# Patient Record
Sex: Male | Born: 1985 | Race: White | Hispanic: No | Smoking: Former smoker
Health system: Southern US, Community
[De-identification: ages and names within clinical notes are randomized; demographics above are authoritative.]

## PROBLEM LIST (undated history)

## (undated) DIAGNOSIS — K219 Gastro-esophageal reflux disease without esophagitis: Secondary | ICD-10-CM

## (undated) DIAGNOSIS — F909 Attention-deficit hyperactivity disorder, unspecified type: Secondary | ICD-10-CM

## (undated) HISTORY — PX: EYE SURGERY: SHX253

## (undated) HISTORY — PX: ABDOMINAL SURGERY: SHX537

## (undated) HISTORY — PX: HAND SURGERY: SHX662

## (undated) HISTORY — PX: TONSILLECTOMY: SUR1361

---

## 2015-01-25 ENCOUNTER — Encounter (HOSPITAL_COMMUNITY): Payer: Self-pay | Admitting: Emergency Medicine

## 2015-01-25 ENCOUNTER — Emergency Department (HOSPITAL_COMMUNITY)
Admission: EM | Admit: 2015-01-25 | Discharge: 2015-01-25 | Disposition: A | Discharge status: Home / Self Care | Payer: 59 | Attending: Emergency Medicine | Admitting: Emergency Medicine

## 2015-01-25 DIAGNOSIS — S0101XA Laceration without foreign body of scalp, initial encounter: Secondary | ICD-10-CM | POA: Insufficient documentation

## 2015-01-25 DIAGNOSIS — Y929 Unspecified place or not applicable: Secondary | ICD-10-CM | POA: Diagnosis not present

## 2015-01-25 DIAGNOSIS — W228XXA Striking against or struck by other objects, initial encounter: Secondary | ICD-10-CM | POA: Diagnosis not present

## 2015-01-25 DIAGNOSIS — Z720 Tobacco use: Secondary | ICD-10-CM | POA: Insufficient documentation

## 2015-01-25 DIAGNOSIS — Y9389 Activity, other specified: Secondary | ICD-10-CM | POA: Insufficient documentation

## 2015-01-25 DIAGNOSIS — Y998 Other external cause status: Secondary | ICD-10-CM | POA: Insufficient documentation

## 2015-01-25 MED ORDER — LIDOCAINE-EPINEPHRINE (PF) 2 %-1:200000 IJ SOLN
10.0000 mL | Freq: Once | INTRAMUSCULAR | Status: AC
  Administered 2015-01-25: 10 mL
  Filled 2015-01-25: qty 20

## 2015-01-25 MED ORDER — TETANUS-DIPHTH-ACELL PERTUSSIS 5-2.5-18.5 LF-MCG/0.5 IM SUSP
0.5000 mL | Freq: Once | INTRAMUSCULAR | Status: AC
  Administered 2015-01-25: 0.5 mL via INTRAMUSCULAR
  Filled 2015-01-25: qty 0.5

## 2015-01-25 MED ORDER — BACITRACIN ZINC 500 UNIT/GM EX OINT: 1.0000 "application " | TOPICAL_OINTMENT | Freq: Two times a day (BID) | CUTANEOUS | Status: DC

## 2015-01-25 MED ORDER — NAPROXEN 500 MG PO TABS: 500.0000 mg | ORAL_TABLET | Freq: Two times a day (BID) | ORAL | Status: DC

## 2015-01-25 NOTE — ED Notes (Signed)
Patient here with left head laceration. States that he was "horsing around" and struck his head on the corner of a wall. 3-4" laceration noted to left head; bleeding controlled at this time.

## 2015-01-25 NOTE — Discharge Instructions (Signed)
Facial Laceration ° A facial laceration is a cut on the face. These injuries can be painful and cause bleeding. Lacerations usually heal quickly, but they need special care to reduce scarring. °DIAGNOSIS  °Your health care provider will take a medical history, ask for details about how the injury occurred, and examine the wound to determine how deep the cut is. °TREATMENT  °Some facial lacerations may not require closure. Others may not be able to be closed because of an increased risk of infection. The risk of infection and the chance for successful closure will depend on various factors, including the amount of time since the injury occurred. °The wound may be cleaned to help prevent infection. If closure is appropriate, pain medicines may be given if needed. Your health care provider will use stitches (sutures), wound glue (adhesive), or skin adhesive strips to repair the laceration. These tools bring the skin edges together to allow for faster healing and a better cosmetic outcome. If needed, you may also be given a tetanus shot. °HOME CARE INSTRUCTIONS °· Only take over-the-counter or prescription medicines as directed by your health care provider. °· Follow your health care provider's instructions for wound care. These instructions will vary depending on the technique used for closing the wound. °For Sutures: °· Keep the wound clean and dry.   °· If you were given a bandage (dressing), you should change it at least once a day. Also change the dressing if it becomes wet or dirty, or as directed by your health care provider.   °· Wash the wound with soap and water 2 times a day. Rinse the wound off with water to remove all soap. Pat the wound dry with a clean towel.   °· After cleaning, apply a thin layer of the antibiotic ointment recommended by your health care provider. This will help prevent infection and keep the dressing from sticking.   °· You may shower as usual after the first 24 hours. Do not soak the  wound in water until the sutures are removed.   °· Get your sutures removed as directed by your health care provider. With facial lacerations, sutures should usually be taken out after 4-5 days to avoid stitch marks.   °· Wait a few days after your sutures are removed before applying any makeup. °For Skin Adhesive Strips: °· Keep the wound clean and dry.   °· Do not get the skin adhesive strips wet. You may bathe carefully, using caution to keep the wound dry.   °· If the wound gets wet, pat it dry with a clean towel.   °· Skin adhesive strips will fall off on their own. You may trim the strips as the wound heals. Do not remove skin adhesive strips that are still stuck to the wound. They will fall off in time.   °For Wound Adhesive: °· You may briefly wet your wound in the shower or bath. Do not soak or scrub the wound. Do not swim. Avoid periods of heavy sweating until the skin adhesive has fallen off on its own. After showering or bathing, gently pat the wound dry with a clean towel.   °· Do not apply liquid medicine, cream medicine, ointment medicine, or makeup to your wound while the skin adhesive is in place. This may loosen the film before your wound is healed.   °· If a dressing is placed over the wound, be careful not to apply tape directly over the skin adhesive. This may cause the adhesive to be pulled off before the wound is healed.   °· Avoid   prolonged exposure to sunlight or tanning lamps while the skin adhesive is in place.  The skin adhesive will usually remain in place for 5-10 days, then naturally fall off the skin. Do not pick at the adhesive film.  After Healing: Once the wound has healed, cover the wound with sunscreen during the day for 1 full year. This can help minimize scarring. Exposure to ultraviolet light in the first year will darken the scar. It can take 1-2 years for the scar to lose its redness and to heal completely.  SEEK IMMEDIATE MEDICAL CARE IF:  You have redness, pain, or  swelling around the wound.   You see ayellowish-white fluid (pus) coming from the wound.   You have chills or a fever.  MAKE SURE YOU:  Understand these instructions.  Will watch your condition.  Will get help right away if you are not doing well or get worse. Document Released: 11/28/2004 Document Revised: 08/11/2013 Document Reviewed: 06/03/2013 [facility] Patient Information 2015 [place], [place]. This information is not intended to replace advice given to you by your health care provider. Make sure you discuss any questions you have with your health care provider.  Laceration Care, Adult A laceration is a cut or lesion that goes through all layers of the skin and into the tissue just beneath the skin. TREATMENT  Some lacerations may not require closure. Some lacerations may not be able to be closed due to an increased risk of infection. It is important to see your caregiver as soon as possible after an injury to minimize the risk of infection and maximize the opportunity for successful closure. If closure is appropriate, pain medicines may be given, if needed. The wound will be cleaned to help prevent infection. Your caregiver will use stitches (sutures), staples, wound glue (adhesive), or skin adhesive strips to repair the laceration. These tools bring the skin edges together to allow for faster healing and a better cosmetic outcome. However, all wounds will heal with a scar. Once the wound has healed, scarring can be minimized by covering the wound with sunscreen during the day for 1 full year. HOME CARE INSTRUCTIONS  For sutures or staples:  Keep the wound clean and dry.  If you were given a bandage (dressing), you should change it at least once a day. Also, change the dressing if it becomes wet or dirty, or as directed by your caregiver.  Wash the wound with soap and water 2 times a day. Rinse the wound off with water to remove all soap. Pat the wound dry with a clean  towel.  After cleaning, apply a thin layer of the antibiotic ointment as recommended by your caregiver. This will help prevent infection and keep the dressing from sticking.  You may shower as usual after the first 24 hours. Do not soak the wound in water until the sutures are removed.  Only take over-the-counter or prescription medicines for pain, discomfort, or fever as directed by your caregiver.  Get your sutures or staples removed as directed by your caregiver. For skin adhesive strips:  Keep the wound clean and dry.  Do not get the skin adhesive strips wet. You may bathe carefully, using caution to keep the wound dry.  If the wound gets wet, pat it dry with a clean towel.  Skin adhesive strips will fall off on their own. You may trim the strips as the wound heals. Do not remove skin adhesive strips that are still stuck to the wound. They will fall  off in time. For wound adhesive:  You may briefly wet your wound in the shower or bath. Do not soak or scrub the wound. Do not swim. Avoid periods of heavy perspiration until the skin adhesive has fallen off on its own. After showering or bathing, gently pat the wound dry with a clean towel.  Do not apply liquid medicine, cream medicine, or ointment medicine to your wound while the skin adhesive is in place. This may loosen the film before your wound is healed.  If a dressing is placed over the wound, be careful not to apply tape directly over the skin adhesive. This may cause the adhesive to be pulled off before the wound is healed.  Avoid prolonged exposure to sunlight or tanning lamps while the skin adhesive is in place. Exposure to ultraviolet light in the first year will darken the scar.  The skin adhesive will usually remain in place for 5 to 10 days, then naturally fall off the skin. Do not pick at the adhesive film. You may need a tetanus shot if:  You cannot remember when you had your last tetanus shot.  You have never had a  tetanus shot. If you get a tetanus shot, your arm may swell, get red, and feel warm to the touch. This is common and not a problem. If you need a tetanus shot and you choose not to have one, there is a rare chance of getting tetanus. Sickness from tetanus can be serious. SEEK MEDICAL CARE IF:   You have redness, swelling, or increasing pain in the wound.  You see a red line that goes away from the wound.  You have yellowish-white fluid (pus) coming from the wound.  You have a fever.  You notice a bad smell coming from the wound or dressing.  Your wound breaks open before or after sutures have been removed.  You notice something coming out of the wound such as wood or glass.  Your wound is on your hand or foot and you cannot move a finger or toe. SEEK IMMEDIATE MEDICAL CARE IF:   Your pain is not controlled with prescribed medicine.  You have severe swelling around the wound causing pain and numbness or a change in color in your arm, hand, leg, or foot.  Your wound splits open and starts bleeding.  You have worsening numbness, weakness, or loss of function of any joint around or beyond the wound.  You develop painful lumps near the wound or on the skin anywhere on your body. MAKE SURE YOU:   Understand these instructions.  Will watch your condition.  Will get help right away if you are not doing well or get worse. Document Released: 10/21/2005 Document Revised: 01/13/2012 Document Reviewed: 04/16/2011 [facility] Patient Information 2015 [place], [place]. This information is not intended to replace advice given to you by your health care provider. Make sure you discuss any questions you have with your health care provider.

## 2015-01-25 NOTE — ED Notes (Signed)
Denies LOC, dizziness, lightheadedness; ambulatory in and out of triage.

## 2015-01-25 NOTE — ED Notes (Signed)
Pt states he was "hoarsing around" and hit his head on "the corner of a wall." denies loc, pt alert, oriented x4, nad. Lac to left forehead.

## 2015-01-25 NOTE — ED Provider Notes (Signed)
CSN: [id number]     Arrival date & time 01/25/15  0223 History   First MD Initiated Contact with Patient 01/25/15 0326     Chief Complaint  Patient presents with  . Head Laceration    (Consider location/radiation/quality/duration/timing/severity/associated sxs/prior Treatment) HPI Comments: 29 year old male presents to the emergency department for further evaluation of a scalp laceration which patient obtained while he was "horsing around" and struck his head on the corner of a wall. Patient denies any loss of consciousness, lightheadedness, dizziness, nausea, vomiting, extremity numbness/weakness, vision changes or loss, or hearing changes. No meds taken PTA. Last tetanus unknown.  Patient is a 29 y.o. male presenting with scalp laceration. The history is provided by the patient. No language interpreter was used.  Head Laceration This is a new problem. The current episode started today. The problem occurs constantly. The problem has been unchanged. Pertinent negatives include no nausea, neck pain or vomiting. Nothing aggravates the symptoms. He has tried nothing for the symptoms.    History reviewed. No pertinent past medical history. History reviewed. No pertinent past surgical history. No family history on file. History  Substance Use Topics  . Smoking status: Current Some Day Smoker  . Smokeless tobacco: Not on file  . Alcohol Use: Yes    Review of Systems  HENT: Negative for hearing loss and tinnitus.   Eyes: Negative for visual disturbance.  Gastrointestinal: Negative for nausea and vomiting.  Musculoskeletal: Negative for neck pain.  Skin: Positive for wound.  Neurological: Negative for dizziness, syncope and light-headedness.  All other systems reviewed and are negative.   Allergies  Review of patient's allergies indicates no known allergies.  Home Medications   Prior to Admission medications   Medication Sig Start Date End Date Taking? Authorizing Provider    ibuprofen (ADVIL,MOTRIN) 200 MG tablet Take 600-800 mg by mouth every 6 (six) hours as needed for moderate pain.   Yes Historical Provider, MD  bacitracin ointment Apply 1 application topically 2 (two) times daily. 01/25/15   Antony Madura, PA-C  naproxen (NAPROSYN) 500 MG tablet Take 1 tablet (500 mg total) by mouth 2 (two) times daily. 01/25/15   Antony Madura, PA-C   BP 133/90 mmHg  Pulse 110  Temp(Src) 98.1 F (36.7 C) (Oral)  Resp 20  Ht  (1.702 m)  Wt 260 lb (117.935 kg)  BMI 40.71 kg/m2  SpO2 96%   Physical Exam  Constitutional: He is oriented to person, place, and time. He appears well-developed and well-nourished. No distress.  HENT:  Head: Normocephalic. Head is with laceration. Head is without raccoon's eyes and without Battle's sign.    Mouth/Throat: Oropharynx is clear and moist. No oropharyngeal exudate.  9cm laceration to forehead extending posteriorly to scalp. No active bleeding. No skull instability or FB. No Battle's sign or Raccoon's eyes.  Eyes: Conjunctivae and EOM are normal. Pupils are equal, round, and reactive to light. No scleral icterus.  Pupils equal round and reactive to light  Neck: Normal range of motion.  Pulmonary/Chest: Effort normal. No respiratory distress.  Respirations even and unlabored  Musculoskeletal: Normal range of motion.  Neurological: He is alert and oriented to person, place, and time. No cranial nerve deficit. He exhibits normal muscle tone. Coordination normal.  GCS 15. Speech is goal oriented. Patient moves extremities without ataxia. No focal neurologic deficits noted. He is ambulatory in the ED with steady gait.  Skin: Skin is warm and dry. No rash noted. He is not diaphoretic. No erythema. No  pallor.  Psychiatric: He has a normal mood and affect. His behavior is normal.  Nursing note and vitals reviewed.   ED Course  Procedures (including critical care time) Labs Review Labs Reviewed - No data to display  Imaging  Review No results found.   EKG Interpretation None      LACERATION REPAIR Performed by: Antony MaduraHUMES, Dmarius Reeder Authorized by: Antony MaduraHUMES, Onita Pfluger Consent: Verbal consent obtained. Risks and benefits: risks, benefits and alternatives were discussed Consent given by: patient Patient identity confirmed: provided demographic data Prepped and Draped in normal sterile fashion Wound explored  Laceration Location: scalp and forhead  Laceration Length: 9cm  No Foreign Bodies seen or palpated  Anesthesia: local infiltration  Local anesthetic: lidocaine 2% with epinephrine  Anesthetic total: 4 ml  Irrigation method: syringe Amount of cleaning: standard  Skin closure: 4-0 ethilon and staples  Number of sutures: 6 sutures and 1 staple  Technique: simple interrupted  Patient tolerance: Patient tolerated the procedure well with no immediate complications.  MDM   Final diagnoses:  Scalp laceration, initial encounter    Tdap booster given. Laceration occurred < 8 hours prior to repair which was well tolerated. Pt has no comorbidities to effect normal wound healing. Discussed suture home care with pt and answered questions. Pt to follow up for wound check and suture removal in 7 days. Pt is hemodynamically stable with no complaints prior to discharge. Patient discharged in good condition.   Filed Vitals:   01/25/15 0233 01/25/15 0448  BP: 133/90 115/69  Pulse: 110 98  Temp: 98.1 F (36.7 C)   TempSrc: Oral   Resp: 20 14  Height: 5\' 7"  (1.702 m)   Weight: 260 lb (117.935 kg)   SpO2: 96% 100%     Antony MaduraKelly Chiann Goffredo, PA-C 01/25/15 [id number]  Marisa Severinlga Otter, MD 01/26/15 [phone]

## 2015-01-31 ENCOUNTER — Emergency Department (HOSPITAL_COMMUNITY)
Admission: EM | Admit: 2015-01-31 | Discharge: 2015-01-31 | Disposition: A | Discharge status: Home / Self Care | Payer: 59 | Attending: Emergency Medicine | Admitting: Emergency Medicine

## 2015-01-31 ENCOUNTER — Encounter (HOSPITAL_COMMUNITY): Payer: Self-pay | Admitting: *Deleted

## 2015-01-31 DIAGNOSIS — Z792 Long term (current) use of antibiotics: Secondary | ICD-10-CM | POA: Insufficient documentation

## 2015-01-31 DIAGNOSIS — Z4802 Encounter for removal of sutures: Secondary | ICD-10-CM | POA: Insufficient documentation

## 2015-01-31 DIAGNOSIS — Z791 Long term (current) use of non-steroidal anti-inflammatories (NSAID): Secondary | ICD-10-CM | POA: Diagnosis not present

## 2015-01-31 DIAGNOSIS — Z720 Tobacco use: Secondary | ICD-10-CM | POA: Diagnosis not present

## 2015-01-31 NOTE — ED Provider Notes (Signed)
CSN: [id number]     Arrival date & time 01/31/15  1806 History  This chart was scribed for Chase BattiestElizabeth Carrine Kroboth, NP, working with Doug SouSam Jacubowitz, MD by Elon SpannerGarrett Cook, ED Scribe. This patient was seen in room TR05C/TR05C and the patient's care was started at 6:15 PM.  No chief complaint on file.  The history is provided by the patient. No language interpreter was used.   HPI Comments: Chase Chavez is a 29 y.o. male who presents to the Emergency Department complaining of suture removal.  Patient was seen on 3/23 in the ED after he sustained a head laceration without LOC while horsing around.  A laceration repair was performed and the patient was told to return to ED in 7 days for suture removal.  Patient denies fever, chills.  No past medical history on file. No past surgical history on file. No family history on file. History  Substance Use Topics  . Smoking status: Current Some Day Smoker  . Smokeless tobacco: Not on file  . Alcohol Use: Yes    Review of Systems  Skin: Positive for wound.      Allergies  Review of patient's allergies indicates no known allergies.  Home Medications   Prior to Admission medications   Medication Sig Start Date End Date Taking? Authorizing Provider  bacitracin ointment Apply 1 application topically 2 (two) times daily. 01/25/15   Antony MaduraKelly Humes, PA-C  ibuprofen (ADVIL,MOTRIN) 200 MG tablet Take 600-800 mg by mouth every 6 (six) hours as needed for moderate pain.    Historical Provider, MD  naproxen (NAPROSYN) 500 MG tablet Take 1 tablet (500 mg total) by mouth 2 (two) times daily. 01/25/15   Antony MaduraKelly Humes, PA-C   There were no vitals taken for this visit. Physical Exam  Constitutional: He is oriented to person, place, and time. He appears well-developed and well-nourished. No distress.  HENT:  Head: Normocephalic and atraumatic.  Eyes: Conjunctivae and EOM are normal.  Neck: Neck supple. No tracheal deviation present.  Cardiovascular: Normal rate.    Pulmonary/Chest: Effort normal. No respiratory distress.  Musculoskeletal: Normal range of motion.  Neurological: He is alert and oriented to person, place, and time.  Skin: Skin is warm and dry.  6 external sutures well-healed, well-approximated with staples on left frontal scalp  Psychiatric: He has a normal mood and affect. His behavior is normal.  Nursing note and vitals reviewed.   ED Course  Procedures (including critical care time)  DIAGNOSTIC STUDIES: Oxygen Saturation is 100% on RA, normal by my interpretation.    COORDINATION OF CARE:  6:14 PM Discussed treatment plan with patient at bedside.  Patient acknowledges and agrees with plan.    Labs Review Labs Reviewed - No data to display  Imaging Review No results found.   EKG Interpretation None      MDM   Final diagnoses:  Encounter for removal of sutures  Encounter for removal of staples    29 yo to ER for staple and suture removal and wound check as above. Six external sutures removed and 1 staple removed. Procedure tolerated well. Vitals normal, no signs of infection. Scar minimization & return precautions given at dc.    I personally performed the services described in this documentation, which was scribed in my presence. The recorded information has been reviewed and is accurate.  Filed Vitals:   01/31/15 1816 01/31/15 1933  BP: 143/72 132/72  Pulse: 96 85  Temp: 98.2 F (36.8 C) 98.3 F (36.8 C)  TempSrc:  Oral Oral  Resp: 18 20  SpO2: 98% 99%   Meds given in ED:  Medications - No data to display  Discharge Medication List as of 01/31/2015  7:28 PM        Chase Battiest, NP 02/02/15 1132  Doug Sou, MD 02/02/15 [phone]

## 2015-01-31 NOTE — ED Notes (Signed)
Pt here for suture removal on forehead, sutures were placed on 3/23. No other complaints.

## 2015-01-31 NOTE — Discharge Instructions (Signed)
Please follow the directions provided. Be sure to follow-up with your primary care doctor if needed. Please keep the healed wound clean and dry. Don't hesitate to return for any new, worsening, or concerning symptoms.   SEEK MEDICAL CARE IF:  You have increasing redness, swelling, or pain in the wound.  You see pus coming from the wound.  You have a fever.  You notice a bad smell coming from the wound or dressing.  Your wound breaks open (edges not staying together).

## 2017-10-08 ENCOUNTER — Other Ambulatory Visit: Payer: Self-pay

## 2017-10-08 ENCOUNTER — Emergency Department (HOSPITAL_BASED_OUTPATIENT_CLINIC_OR_DEPARTMENT_OTHER)
Admission: EM | Admit: 2017-10-08 | Discharge: 2017-10-08 | Disposition: A | Discharge status: Home / Self Care | Payer: Worker's Compensation | Attending: Emergency Medicine | Admitting: Emergency Medicine

## 2017-10-08 ENCOUNTER — Encounter (HOSPITAL_BASED_OUTPATIENT_CLINIC_OR_DEPARTMENT_OTHER): Payer: Self-pay

## 2017-10-08 DIAGNOSIS — Y999 Unspecified external cause status: Secondary | ICD-10-CM | POA: Insufficient documentation

## 2017-10-08 DIAGNOSIS — S61412A Laceration without foreign body of left hand, initial encounter: Secondary | ICD-10-CM | POA: Insufficient documentation

## 2017-10-08 DIAGNOSIS — W268XXA Contact with other sharp object(s), not elsewhere classified, initial encounter: Secondary | ICD-10-CM | POA: Diagnosis not present

## 2017-10-08 DIAGNOSIS — Y929 Unspecified place or not applicable: Secondary | ICD-10-CM | POA: Insufficient documentation

## 2017-10-08 DIAGNOSIS — F17210 Nicotine dependence, cigarettes, uncomplicated: Secondary | ICD-10-CM | POA: Insufficient documentation

## 2017-10-08 DIAGNOSIS — S6992XA Unspecified injury of left wrist, hand and finger(s), initial encounter: Secondary | ICD-10-CM | POA: Diagnosis present

## 2017-10-08 DIAGNOSIS — Y9389 Activity, other specified: Secondary | ICD-10-CM | POA: Insufficient documentation

## 2017-10-08 MED ORDER — CEPHALEXIN 500 MG PO CAPS: 500.0000 mg | ORAL_CAPSULE | Freq: Four times a day (QID) | ORAL | 0 refills | Status: DC

## 2017-10-08 MED ORDER — TETANUS-DIPHTH-ACELL PERTUSSIS 5-2.5-18.5 LF-MCG/0.5 IM SUSP: 0.5000 mL | Freq: Once | INTRAMUSCULAR | Status: DC

## 2017-10-08 MED ORDER — LIDOCAINE HCL 2 % IJ SOLN: 10.0000 mL | Freq: Once | INTRAMUSCULAR | Status: DC

## 2017-10-08 MED FILL — CEPHALEXIN 500 MG CAPSULE: 500 | 5 days supply | Qty: 20 | Fill #0

## 2017-10-08 NOTE — ED Notes (Signed)
ED Provider at bedside. 

## 2017-10-08 NOTE — Discharge Instructions (Signed)
Please see the information and instructions below regarding your visit.  Your diagnoses today include:  1. Laceration of left hand without foreign body, initial encounter     Dr. Janee Mornhompson will see you tomorrow.  We closed this with one suture.  Tests performed today include: Vital signs. See below for your results today.   Medications prescribed:   Take any prescribed medications only as directed.  Ibuprofen alternating with Tylenol for pain.  You can take 400-600 mg of ibuprofen every 6 hours as needed for pain.  You may take 650 mg of Tylenol every 6 hours as needed for pain.  Please do not exceed 4 g of Tylenol in 1 day.  Home care instructions:  Follow any educational materials and wound care instructions contained in this packet.   Keep affected area above the level of your heart when possible to minimize swelling. Wash area gently twice a day with warm soapy water. Do not apply alcohol or hydrogen peroxide directly over a wound. Cover the area if it is draining or weeping. Keep the bandage in place for 24 hours and refrain from getting the wound wet for 24 hours. After that, you may get the area wet, but please ensure that you dry it completely afterwards.  Please refrain from soaking sutures for long periods of time, or swimming in chlorinated water   You may apply antibiotic ointment such as Bacitracin or Neosporin.  Follow-up instructions: Please see Dr. Janee Mornhompson and hand surgery tomorrow at 12:45 Pm.  Return instructions:  Return to the Emergency Department if you have: Fever Worsening pain Worsening swelling of the wound Pus draining from the wound Redness of the skin that moves away from the wound, especially if it streaks away from the affected area  Any other emergent concerns  Your vital signs today were: BP 113/71 (BP Location: Right Arm)    Pulse 87    Temp 98.4 F (36.9 C) (Oral)    Resp 18    Ht 5\' 7"  (1.702 m)    Wt 106.6 kg (235 lb)    SpO2 98%    BMI 36.81  kg/m  If your blood pressure (BP) was elevated on multiple readings during this visit above 130 for the top number or above 80 for the bottom number, please have this repeated by your primary care provider within one month. --------------  Thank you for allowing us to participate in your care today! It was a pleasure taking care of you.

## 2017-10-08 NOTE — ED Provider Notes (Signed)
[facility] EMERGENCY DEPARTMENT Provider Note   CSN: [id number] Arrival date & time: 10/08/17  1408     History   Chief Complaint Chief Complaint  Patient presents with  . Hand Injury    HPI Ray ChurchLogan Blevens is a 31 y.o. male.  HPI   Patient is a 31 year old male with no significant past medical history presenting for a laceration to his left hand.  Patient reports that around 9:30 AM on 10-08-2017, he was cutting zip ties with a box cutter and cut his hand in the process of cutting a zip tie.  Patient reports that immediately after this experience, he had numbness to the radial side of his fourth digit on the left hand.  Patient did not appreciate any weakness.  Minimal bleeding at the time.  Patient immediately covered the wound.  Tetanus shot is up-to-date.  History reviewed. No pertinent past medical history.  There are no active problems to display for this patient.   Past Surgical History:  Procedure Laterality Date  . EYE SURGERY         Home Medications    Prior to Admission medications   Medication Sig Start Date End Date Taking? Authorizing Provider  cephALEXin (KEFLEX) 500 MG capsule Take 1 capsule (500 mg total) by mouth 4 (four) times daily. 10/08/17   Aviva KluverMurray, Aviyon Hocevar B, PA-C  ibuprofen (ADVIL,MOTRIN) 200 MG tablet Take 600-800 mg by mouth every 6 (six) hours as needed for moderate pain.    [provider]    Family History No family history on file.  Social History Social History   Tobacco Use  . Smoking status: Current Every Day Smoker    Types: Cigarettes  . Smokeless tobacco: Never Used  Substance Use Topics  . Alcohol use: Yes    Comment: occ  . Drug use: No     Allergies   Patient has no known allergies.   Review of Systems Review of Systems  Skin: Positive for wound. Negative for color change.  Neurological: Positive for numbness. Negative for weakness.     Physical Exam Updated Vital Signs BP 113/71 (BP  Location: Right Arm)   Pulse 87   Temp 98.4 F (36.9 C) (Oral)   Resp 18   Ht 5\' 7"  (1.702 m)   Wt 106.6 kg (235 lb)   SpO2 98%   BMI 36.81 kg/m   Physical Exam  Constitutional: He appears well-developed and well-nourished. No distress.  Sitting comfortably in bed.  HENT:  Head: Normocephalic and atraumatic.  Eyes: Conjunctivae are normal. Right eye exhibits no discharge. Left eye exhibits no discharge.  EOMs normal to gross examination.  Neck: Normal range of motion.  Cardiovascular: Normal rate and regular rhythm.  Intact, 2+ radial and ulnar pulse of the left hand.  Pulmonary/Chest:  Normal respiratory effort. Patient converses comfortably. No audible wheeze or stridor.  Abdominal: He exhibits no distension.  Musculoskeletal: Normal range of motion.  Left Hand Exam:  Inspection: There is approximately 2.5 cm laceration extending down to the adipose of the distal palm of the left hand.  Please see picture for details.  There is an exposed artery that is intact.  No tendon exposure. ROM: Passive/active ROM intact at wrist, MCP, PIP, and DIP joints, thumb MCP and IP joints, and no rotational deformity of metacarpals noted. Ligamentous stability: No laxity to valgus/varus stress of MCP, PIP, or DIP joints. Flexor/Extensor tendons: FDS/FDP tendons intact in digits 2-5 at PIP/DIP joints, respectively; extensor tendons intact in  all digits Nerve testing:  Patient is numb on the radial side of the left fourth digit.  Area of greatest numbness between PIP and DIP joints.  Patient preserved sensation at the distal tip of the fourth digit of the left hand, consistent with radial digital nerve.  Patient also preserves sensation over the entire ulnar side of the third digit. Vascular: 2+ radial and ulnar pulses. Capillary refill <2 seconds b/l.  Neurological: He is alert.  Cranial nerves intact to gross observation. Patient moves extremities without difficulty.  Skin: Skin is warm and dry.  He is not diaphoretic.  Psychiatric: He has a normal mood and affect. His behavior is normal. Judgment and thought content normal.  Nursing note and vitals reviewed.      ED Treatments / Results  Labs (all labs ordered are listed, but only abnormal results are displayed) Labs Reviewed - No data to display  EKG  EKG Interpretation None       Radiology No results found.  Procedures .Marland KitchenLaceration Repair Date/Time: 10/08/2017 5:24 PM Performed by: Elisha Ponder, PA-C Authorized by: Elisha Ponder, PA-C   Consent:    Consent obtained:  Verbal   Consent given by:  Patient Anesthesia (see MAR for exact dosages):    Anesthesia method:  Local infiltration   Local anesthetic:  Lidocaine 2% w/o epi Laceration details:    Location:  Hand   Hand location:  L palm   Length (cm):  2.5 Repair type:    Repair type:  Simple Pre-procedure details:    Preparation:  Patient was prepped and draped in usual sterile fashion Exploration:    Hemostasis achieved with:  Direct pressure   Wound exploration: wound explored through full range of motion and entire depth of wound probed and visualized     Wound extent: areolar tissue violated     Wound extent comment:  Likely nerve damage to radial digital nerve.    Contaminated: no   Treatment:    Area cleansed with:  Betadine   Amount of cleaning:  Standard   Irrigation solution:  Sterile saline   Irrigation method:  Syringe Skin repair:    Repair method:  Sutures   Suture size:  4-0   Wound skin closure material used: Vicryl Rapide.   Suture technique:  Simple interrupted   Number of sutures:  1 Approximation:    Approximation:  Loose Post-procedure details:    Dressing:  Non-adherent dressing   Patient tolerance of procedure:  Tolerated well, no immediate complications   (including critical care time)  Medications Ordered in ED Medications  lidocaine (XYLOCAINE) 2 % (with pres) injection 200 mg (not administered)      Initial Impression / Assessment and Plan / ED Course  I have reviewed the triage vital signs and the nursing notes.  Pertinent labs & imaging results that were available during my care of the patient were reviewed by me and considered in my medical decision making (see chart for details).      Final Clinical Impressions(s) / ED Diagnoses   Final diagnoses:  Laceration of left hand without foreign body, initial encounter   Patient is well-appearing in no acute distress.  Patient demonstrates a possible radial digital nerve injury to the radial aspect of the left fourth finger.  Wound was irrigated with 1000 mL of sterile saline.  Tetanus is up-to-date.  This wound was discussed via phone with Dr. Janee Morn.  Plan to place 1 Vicryl Rapide suture to hold the wound  together, dressed, and follow-up tomorrow in the office.  Keflex for prophylaxis due to delayed primary closure.  Patient given return precautions for any erythema or purulent drainage.  Patient is understanding and agrees with the plan of care.  This is a supervised visit with Dr. Rolland PorterMark James. Evaluation, management, and discharge planning discussed with this attending physician.   ED Discharge Orders        Ordered    cephALEXin (KEFLEX) 500 MG capsule  4 times daily     10/08/17 1711        Delia ChimesMurray, Joss Mcdill B, PA-C 10/08/17 1726    Rolland PorterJames, Mark, MD 10/12/17 [phone]

## 2017-10-08 NOTE — ED Triage Notes (Signed)
Pt states he cut left hand cutting through a zip tie at work-[facility]-pt has coban wrap to left hand-NAD-steady gait

## 2017-10-08 NOTE — ED Notes (Addendum)
Bulky dressing applied to palm of hand with xeroform per PA request

## 2018-02-10 ENCOUNTER — Emergency Department (HOSPITAL_BASED_OUTPATIENT_CLINIC_OR_DEPARTMENT_OTHER)
Admission: EM | Admit: 2018-02-10 | Discharge: 2018-02-10 | Disposition: A | Discharge status: Home / Self Care | Payer: 59 | Attending: Emergency Medicine | Admitting: Emergency Medicine

## 2018-02-10 ENCOUNTER — Encounter (HOSPITAL_BASED_OUTPATIENT_CLINIC_OR_DEPARTMENT_OTHER): Payer: Self-pay

## 2018-02-10 DIAGNOSIS — L509 Urticaria, unspecified: Secondary | ICD-10-CM | POA: Diagnosis not present

## 2018-02-10 DIAGNOSIS — T7840XA Allergy, unspecified, initial encounter: Secondary | ICD-10-CM | POA: Diagnosis not present

## 2018-02-10 DIAGNOSIS — R21 Rash and other nonspecific skin eruption: Secondary | ICD-10-CM | POA: Diagnosis present

## 2018-02-10 DIAGNOSIS — L299 Pruritus, unspecified: Secondary | ICD-10-CM | POA: Diagnosis not present

## 2018-02-10 DIAGNOSIS — F17210 Nicotine dependence, cigarettes, uncomplicated: Secondary | ICD-10-CM | POA: Diagnosis not present

## 2018-02-10 LAB — CBC WITH DIFFERENTIAL/PLATELET
BASOS ABS: 0 10*3/uL (ref 0.0–0.1)
Basophils Relative: 0 %
Eosinophils Absolute: 0.3 10*3/uL (ref 0.0–0.7)
Eosinophils Relative: 3 %
HCT: 43 % (ref 39.0–52.0)
HEMOGLOBIN: 14.7 g/dL (ref 13.0–17.0)
Lymphocytes Relative: 19 %
Lymphs Abs: 2 10*3/uL (ref 0.7–4.0)
MCH: 31.5 pg (ref 26.0–34.0)
MCHC: 34.2 g/dL (ref 30.0–36.0)
MCV: 92.3 fL (ref 78.0–100.0)
MONOS PCT: 8 %
Monocytes Absolute: 0.8 10*3/uL (ref 0.1–1.0)
Neutro Abs: 7.5 10*3/uL (ref 1.7–7.7)
Neutrophils Relative %: 70 %
Platelets: 272 10*3/uL (ref 150–400)
RBC: 4.66 MIL/uL (ref 4.22–5.81)
RDW: 12.3 % (ref 11.5–15.5)
WBC: 10.5 10*3/uL (ref 4.0–10.5)

## 2018-02-10 LAB — COMPREHENSIVE METABOLIC PANEL
ALBUMIN: 3.5 g/dL (ref 3.5–5.0)
ALK PHOS: 94 U/L (ref 38–126)
ALT: 32 U/L (ref 17–63)
AST: 19 U/L (ref 15–41)
Anion gap: 9 (ref 5–15)
BILIRUBIN TOTAL: 0.5 mg/dL (ref 0.3–1.2)
BUN: 18 mg/dL (ref 6–20)
CALCIUM: 8.6 mg/dL — AB (ref 8.9–10.3)
CO2: 23 mmol/L (ref 22–32)
CREATININE: 1.07 mg/dL (ref 0.61–1.24)
Chloride: 103 mmol/L (ref 101–111)
GFR calc Af Amer: >60 mL/min (ref >60)
GFR calc non Af Amer: >60 mL/min (ref >60)
Glucose, Bld: 237 mg/dL — ABNORMAL HIGH (ref 65–99)
Potassium: 3.9 mmol/L (ref 3.5–5.1)
Sodium: 135 mmol/L (ref 135–145)
TOTAL PROTEIN: 6.6 g/dL (ref 6.5–8.1)

## 2018-02-10 MED ORDER — TRIAMCINOLONE ACETONIDE 0.1 % EX CREA: 1.0000 "application " | TOPICAL_CREAM | Freq: Three times a day (TID) | CUTANEOUS | 0 refills | Status: DC

## 2018-02-10 MED ORDER — HYDROXYZINE HCL 25 MG PO TABS
25.0000 mg | ORAL_TABLET | Freq: Once | ORAL | Status: AC
  Administered 2018-02-10: 25 mg via ORAL
  Filled 2018-02-10: qty 1

## 2018-02-10 MED ORDER — SODIUM CHLORIDE 0.9 % IV BOLUS
1000.0000 mL | Freq: Once | INTRAVENOUS | Status: AC
  Administered 2018-02-10: 1000 mL via INTRAVENOUS

## 2018-02-10 MED ORDER — PREDNISONE 20 MG PO TABS: ORAL_TABLET | ORAL | 0 refills | Status: DC

## 2018-02-10 MED ORDER — FAMOTIDINE IN NACL 20-0.9 MG/50ML-% IV SOLN
20.0000 mg | Freq: Once | INTRAVENOUS | Status: AC
  Administered 2018-02-10: 20 mg via INTRAVENOUS
  Filled 2018-02-10: qty 50

## 2018-02-10 MED ORDER — METHYLPREDNISOLONE SODIUM SUCC 125 MG IJ SOLR
125.0000 mg | Freq: Once | INTRAMUSCULAR | Status: AC
  Administered 2018-02-10: 125 mg via INTRAVENOUS
  Filled 2018-02-10: qty 2

## 2018-02-10 MED FILL — TRIAMCINOLONE 0.1% CREAM: 0.1 | 10 days supply | Qty: 30 | Fill #0

## 2018-02-10 MED FILL — predniSONE 20 MG TABS: 20 | 6 days supply | Qty: 12 | Fill #0

## 2018-02-10 NOTE — ED Triage Notes (Signed)
Pt states developed redness and swelling around 10p last night, took 2 benadryl and zertec with no relief, worse this am with rt eye swollen shut. Unknown cause, now having redness to bilateral arms and hands; took 3 benadryl at 8am

## 2018-02-10 NOTE — ED Provider Notes (Signed)
[facility] EMERGENCY DEPARTMENT Provider Note   CSN: [id number] Arrival date & time: 02/10/18  0857     History   Chief Complaint Chief Complaint  Patient presents with  . Allergic Reaction    HPI Chase Chavez is a 32 y.o. male who presented with possible allergic reaction.  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  He states that he was under the sink but denies being bitten by anything but went home around 2 AM and notice a rash in his face as well as diffuse itchiness.  He took some Benadryl at that time and then the hives got worse today and now he states that he has a hard time opening his eyes.  Denies any throat closing or shortness of breath.  He took 3 benadryl at 8 am.  Previous allergic reactions before but never had allergy testing.  Does have a history of eczema but is not currently on oral steroids.   The history is provided by the patient.    History reviewed. No pertinent past medical history.  There are no active problems to display for this patient.   Past Surgical History:  Procedure Laterality Date  . EYE SURGERY          Home Medications    Prior to Admission medications   Medication Sig Start Date End Date Taking? Authorizing Provider  cephALEXin (KEFLEX) 500 MG capsule Take 1 capsule (500 mg total) by mouth 4 (four) times daily. 10/08/17   Aviva Kluver B, PA-C  ibuprofen (ADVIL,MOTRIN) 200 MG tablet Take 600-800 mg by mouth every 6 (six) hours as needed for moderate pain.    [provider]    Family History No family history on file.  Social History Social History   Tobacco Use  . Smoking status: Current Every Day Smoker    Types: Cigarettes  . Smokeless tobacco: Never Used  Substance Use Topics  . Alcohol use: Yes    Comment: occ  . Drug use: No     Allergies   Patient has no known allergies.   Review of Systems Review of Systems  Skin: Positive for rash.    All other systems reviewed and are negative.    Physical Exam Updated Vital Signs BP 125/73 (BP Location: Right Arm)   Pulse 86   Temp 98.9 F (37.2 C) (Oral)   Resp 18   Ht 5\' 7"  (1.702 m)   Wt 104.3 kg (230 lb)   SpO2 96%   BMI 36.02 kg/m   Physical Exam  Constitutional: He is oriented to person, place, and time. He appears well-developed.  HENT:  Head: Normocephalic.  Mouth/Throat: Oropharynx is clear and moist.  Eyes: EOM are normal.  Redness involving bilateral lower eyelids, worse on R side. No obvious conjunctivitis. Rash doesn't involve the inner lip or MM   Neck: Normal range of motion. Neck supple.  Cardiovascular: Normal rate, regular rhythm and normal heart sounds.  Pulmonary/Chest: Effort normal and breath sounds normal. No stridor. No respiratory distress. He has no wheezes.  Abdominal: Soft. Bowel sounds are normal. He exhibits no distension. There is no tenderness. There is no guarding.  Musculoskeletal: Normal range of motion.  Neurological: He is alert and oriented to person, place, and time.  Skin: Skin is warm.  Urticaria on R arm and R foot. There is eczema R foot with no signs of cellulitis   Psychiatric: He has a normal mood and  affect.  Nursing note and vitals reviewed.    ED Treatments / Results  Labs (all labs ordered are listed, but only abnormal results are displayed) Labs Reviewed  COMPREHENSIVE METABOLIC PANEL - Abnormal; Notable for the following components:      Result Value   Glucose, Bld 237 (*)    Calcium 8.6 (*)    All other components within normal limits  CBC WITH DIFFERENTIAL/PLATELET    EKG None  Radiology No results found.  Procedures Procedures (including critical care time)  Medications Ordered in ED Medications  sodium chloride 0.9 % bolus 1,000 mL (1,000 mLs Intravenous New Bag/Given 02/10/18 0936)  methylPREDNISolone sodium succinate (SOLU-MEDROL) 125 mg/2 mL injection 125 mg (125 mg Intravenous Given 02/10/18  0937)  famotidine (PEPCID) IVPB 20 mg premix (0 mg Intravenous Stopped 02/10/18 1012)  hydrOXYzine (ATARAX/VISTARIL) tablet 25 mg (25 mg Oral Given 02/10/18 1011)     Initial Impression / Assessment and Plan / ED Course  I have reviewed the triage vital signs and the nursing notes.  Pertinent labs & imaging results that were available during my care of the patient were reviewed by me and considered in my medical decision making (see chart for details).     Chase Chavez is a 32 y.o. male here with rash. Likely allergic reaction vs contact dermatitis. No known insect bites to suggest cellulitis. Afebrile, OP clear and lungs clear. Has hx of eczema and had previous allergic reactions. He took benadryl prior to arrival. Will give steroids and pepcid and atarax and reassess.   11:29 AM Rash on face and arms improved. Itching slightly improved. Labs unremarkable. Will dc home with prednisone, triamcinolone cream, benadryl 50 mg every 6 hrs. Recommend allergy testing outpatient     Final Clinical Impressions(s) / ED Diagnoses   Final diagnoses:  None    ED Discharge Orders    None       Charlynne PanderYao, Naleigha Raimondi Hsienta, MD 02/10/18 1130

## 2018-02-10 NOTE — Discharge Instructions (Addendum)
Take prednisone as prescribed.   Take benadryl 50 mg every 6 hrs for itchiness.   Apply triamcinolone cream to rash three times daily for itchiness. Be careful around the eyes but you may apply on the face  See your doctor. Consider allergy testing once this resolves   Return to ER if you have worse rash, trouble swallowing or trouble breathing, fever

## 2018-07-19 ENCOUNTER — Emergency Department (HOSPITAL_BASED_OUTPATIENT_CLINIC_OR_DEPARTMENT_OTHER): Payer: 59

## 2018-07-19 ENCOUNTER — Other Ambulatory Visit: Payer: Self-pay

## 2018-07-19 ENCOUNTER — Emergency Department (HOSPITAL_BASED_OUTPATIENT_CLINIC_OR_DEPARTMENT_OTHER)
Admission: EM | Admit: 2018-07-19 | Discharge: 2018-07-19 | Disposition: A | Discharge status: Home / Self Care | Payer: 59 | Attending: Emergency Medicine | Admitting: Emergency Medicine

## 2018-07-19 ENCOUNTER — Encounter (HOSPITAL_BASED_OUTPATIENT_CLINIC_OR_DEPARTMENT_OTHER): Payer: Self-pay

## 2018-07-19 DIAGNOSIS — F17210 Nicotine dependence, cigarettes, uncomplicated: Secondary | ICD-10-CM | POA: Insufficient documentation

## 2018-07-19 DIAGNOSIS — N5089 Other specified disorders of the male genital organs: Secondary | ICD-10-CM | POA: Diagnosis present

## 2018-07-19 DIAGNOSIS — N492 Inflammatory disorders of scrotum: Secondary | ICD-10-CM | POA: Insufficient documentation

## 2018-07-19 LAB — BASIC METABOLIC PANEL
Anion gap: 8 (ref 5–15)
BUN: 22 mg/dL — ABNORMAL HIGH (ref 6–20)
CALCIUM: 9.1 mg/dL (ref 8.9–10.3)
CO2: 28 mmol/L (ref 22–32)
Chloride: 101 mmol/L (ref 98–111)
Creatinine, Ser: 0.98 mg/dL (ref 0.61–1.24)
GFR calc Af Amer: >60 mL/min (ref >60)
GFR calc non Af Amer: >60 mL/min (ref >60)
GLUCOSE: 110 mg/dL — AB (ref 70–99)
Potassium: 4.7 mmol/L (ref 3.5–5.1)
Sodium: 137 mmol/L (ref 135–145)

## 2018-07-19 LAB — URINALYSIS, MICROSCOPIC (REFLEX)

## 2018-07-19 LAB — URINALYSIS, ROUTINE W REFLEX MICROSCOPIC
Bilirubin Urine: NEGATIVE
Glucose, UA: NEGATIVE mg/dL
KETONES UR: 15 mg/dL — AB
Leukocytes, UA: NEGATIVE
Nitrite: NEGATIVE
PH: 5.5 (ref 5.0–8.0)
Protein, ur: NEGATIVE mg/dL
Specific Gravity, Urine: 1.025 (ref 1.005–1.030)

## 2018-07-19 LAB — CBC WITH DIFFERENTIAL/PLATELET
BASOS PCT: 0 %
Basophils Absolute: 0 10*3/uL (ref 0.0–0.1)
Eosinophils Absolute: 0.1 10*3/uL (ref 0.0–0.7)
Eosinophils Relative: 1 %
HCT: 46.1 % (ref 39.0–52.0)
Hemoglobin: 15.7 g/dL (ref 13.0–17.0)
Lymphocytes Relative: 10 %
Lymphs Abs: 1.8 10*3/uL (ref 0.7–4.0)
MCH: 31.3 pg (ref 26.0–34.0)
MCHC: 34.1 g/dL (ref 30.0–36.0)
MCV: 92 fL (ref 78.0–100.0)
MONO ABS: 1.4 10*3/uL — AB (ref 0.1–1.0)
MONOS PCT: 8 %
NEUTROS ABS: 14.3 10*3/uL — AB (ref 1.7–7.7)
Neutrophils Relative %: 81 %
Platelets: 297 10*3/uL (ref 150–400)
RBC: 5.01 MIL/uL (ref 4.22–5.81)
RDW: 12.4 % (ref 11.5–15.5)
WBC: 17.7 10*3/uL — ABNORMAL HIGH (ref 4.0–10.5)

## 2018-07-19 MED ORDER — CLINDAMYCIN HCL 150 MG PO CAPS: 300.0000 mg | ORAL_CAPSULE | Freq: Four times a day (QID) | ORAL | 0 refills | Status: DC

## 2018-07-19 MED ORDER — ACETAMINOPHEN 325 MG PO TABS
650.0000 mg | ORAL_TABLET | Freq: Once | ORAL | Status: AC
  Administered 2018-07-19: 650 mg via ORAL
  Filled 2018-07-19: qty 2

## 2018-07-19 MED ORDER — CLINDAMYCIN PHOSPHATE 600 MG/50ML IV SOLN
600.0000 mg | Freq: Once | INTRAVENOUS | Status: AC
  Administered 2018-07-19: 600 mg via INTRAVENOUS
  Filled 2018-07-19: qty 50

## 2018-07-19 MED ORDER — SODIUM CHLORIDE 0.9 % IV SOLN
INTRAVENOUS | Status: DC | PRN
  Administered 2018-07-19: 250 mL via INTRAVENOUS

## 2018-07-19 MED ORDER — CLINDAMYCIN HCL 150 MG PO CAPS
300.0000 mg | ORAL_CAPSULE | Freq: Once | ORAL | Status: AC
  Administered 2018-07-19: 300 mg via ORAL
  Filled 2018-07-19: qty 2

## 2018-07-19 NOTE — ED Provider Notes (Signed)
[facility] EMERGENCY DEPARTMENT Provider Note   CSN: [id number] Arrival date & time: 07/19/18  1329     History   Chief Complaint Chief Complaint  Patient presents with  . Groin Swelling    HPI Chase Chavez is a 32 y.o. male.  Patient presents today with complaint of acute onset bilateral testicular and groin swelling first noticed when he awoke at approximately 9:00 AM today.  Patient initially had swelling and pain along with palpating a mass in the scrotrum that has not been there before.  Swelling is progressed during the morning and early afternoon.  No associated fevers, nausea or vomiting, dysuria.  No penile drainage or discharge.  Area is tender.  Patient's wife concerned about how quickly the swelling has occurred.  He has never had symptoms like this in the past.  He denies any injuries.  No treatments prior to arrival.     History reviewed. No pertinent past medical history.  There are no active problems to display for this patient.   Past Surgical History:  Procedure Laterality Date  . ABDOMINAL SURGERY    . EYE SURGERY    . HAND SURGERY    . TONSILLECTOMY          Home Medications    Prior to Admission medications   Medication Sig Start Date End Date Taking? Authorizing Provider  cephALEXin (KEFLEX) 500 MG capsule Take 1 capsule (500 mg total) by mouth 4 (four) times daily. 10/08/17   Aviva Kluver B, PA-C  ibuprofen (ADVIL,MOTRIN) 200 MG tablet Take 600-800 mg by mouth every 6 (six) hours as needed for moderate pain.    [provider]  predniSONE (DELTASONE) 20 MG tablet Take 60 mg daily x 2 days then 40 mg daily x 2 days then 20 mg daily x 2 days 02/10/18   Charlynne Pander, MD  triamcinolone cream (KENALOG) 0.1 % Apply 1 application topically 3 (three) times daily. 02/10/18   Charlynne Pander, MD    Family History No family history on file.  Social History Social History   Tobacco Use  . Smoking status: Current Every  Day Smoker    Types: Cigarettes  . Smokeless tobacco: Never Used  Substance Use Topics  . Alcohol use: Yes    Comment: occ  . Drug use: No     Allergies   Patient has no known allergies.   Review of Systems Review of Systems  Constitutional: Negative for fever.  HENT: Negative for rhinorrhea and sore throat.   Eyes: Negative for redness.  Respiratory: Negative for cough.   Cardiovascular: Negative for chest pain.  Gastrointestinal: Negative for abdominal pain, diarrhea, nausea and vomiting.  Genitourinary: Positive for scrotal swelling and testicular pain. Negative for discharge, dysuria, flank pain, penile pain and penile swelling.  Musculoskeletal: Negative for myalgias.  Skin: Negative for rash.  Neurological: Negative for headaches.     Physical Exam Updated Vital Signs BP (!) 147/80 (BP Location: Left Arm)   Pulse 97   Temp 99.3 F (37.4 C) (Oral)   Resp 20   Ht 5\' 7"  (1.702 m)   Wt 108.9 kg   SpO2 97%   BMI 37.59 kg/m   Physical Exam  Constitutional: He appears well-developed and well-nourished.  HENT:  Head: Normocephalic and atraumatic.  Eyes: Conjunctivae are normal. Right eye exhibits no discharge. Left eye exhibits no discharge.  Neck: Normal range of motion. Neck supple.  Cardiovascular: Normal rate, regular rhythm and normal heart sounds.  Pulmonary/Chest: Effort normal and breath sounds normal.  Abdominal: Soft. There is no tenderness.  Genitourinary: Right testis shows swelling and tenderness. Left testis shows swelling and tenderness.  Genitourinary Comments: Firm swelling noted posterior aspect of the scrotum that seems to be soft tissue in nature.   Neurological: He is alert.  Skin: Skin is warm and dry.  Psychiatric: He has a normal mood and affect.  Nursing note and vitals reviewed.    ED Treatments / Results  Labs (all labs ordered are listed, but only abnormal results are displayed) Labs Reviewed  URINALYSIS, ROUTINE W REFLEX  MICROSCOPIC - Abnormal; Notable for the following components:      Result Value   APPearance HAZY (*)    Hgb urine dipstick SMALL (*)    Ketones, ur 15 (*)    All other components within normal limits  CBC WITH DIFFERENTIAL/PLATELET - Abnormal; Notable for the following components:   WBC 17.7 (*)    Neutro Abs 14.3 (*)    Monocytes Absolute 1.4 (*)    All other components within normal limits  BASIC METABOLIC PANEL - Abnormal; Notable for the following components:   Glucose, Bld 110 (*)    BUN 22 (*)    All other components within normal limits  URINALYSIS, MICROSCOPIC (REFLEX) - Abnormal; Notable for the following components:   Bacteria, UA MANY (*)    All other components within normal limits    EKG None  Radiology US Scrotum W/doppler  Result Date: 07/19/2018 CLINICAL DATA:  Palpable inferior right scrotal mass. EXAM: SCROTAL ULTRASOUND DOPPLER ULTRASOUND OF THE TESTICLES TECHNIQUE: Complete ultrasound examination of the testicles, epididymis, and other scrotal structures was performed. Color and spectral Doppler ultrasound were also utilized to evaluate blood flow to the testicles. COMPARISON:  None. FINDINGS: Right testicle Measurements: 4.5 x 2.3 x 3.0 cm. Symmetric and homogeneous echotexture without focal lesion. Patent arterial and venous blood flow. Left testicle Measurements: 4.5 x 2.2 x 2.9 cm. Symmetric and homogeneous echotexture without focal lesion. Patent arterial and venous blood flow. Right epididymis:  Normal in size and appearance. Left epididymis:  Normal in size and appearance. Hydrocele:  None visualized. Varicocele:  None visualized. Pulsed Doppler interrogation of both testes demonstrates normal low resistance arterial and venous waveforms bilaterally. Other: The patient's palpable abnormality along the inferior aspect of the right hemiscrotum demonstrates fairly marked scrotal wall thickening and heterogeneity along with a small complex heterogeneous fluid  collection. Findings most likely representing focal scrotal cellulitis and or folliculitis with inflammatory phlegmon without discrete drainable abscess. IMPRESSION: 1. Normal sonographic appearance of both testicles. Normal blood flow. 2. No findings for epididymitis and no hydroceles. 3. Patient's palpable abnormality is most likely a scrotal skin infection/cellulitis/folliculitis. Electronically Signed   By: Rudie Meyer M.D.   On: 07/19/2018 15:15    Procedures Procedures (including critical care time)  Medications Ordered in ED Medications - No data to display   Initial Impression / Assessment and Plan / ED Course  I have reviewed the triage vital signs and the nursing notes.  Pertinent labs & imaging results that were available during my care of the patient were reviewed by me and considered in my medical decision making (see chart for details).     Patient seen and examined. Will need [place] to r/o torsion and evaluate for infection.   Vital signs reviewed and are as follows: BP (!) 147/80 (BP Location: Left Arm)   Pulse 97   Temp 99.3 F (37.4 C) (Oral)  Resp 20   Ht 5\' 7"  (1.702 m)   Wt 108.9 kg   SpO2 97%   BMI 37.59 kg/m   Patient and wife updated on results.  Given evidence of cellulitis, will give IV clindamycin and check lab work.  Patient will be reassessed for progressing exam.  5:33 PM patient reassessed.  Vital signs are still within normal limits.  His exam is unchanged from previous.  There has been no extension of cellulitis onto the perineum, lower pelvis or legs.  He is minimally tender.  I do not feel any crepitus in the soft tissues.  At this time, patient will be discharged home.  We discussed at length the need for close follow-up.  Patient does not think he would be oh to get his antibiotics filled until the morning.  He will be given 1 oral dose to take between 10 and 11 PM tonight and he will fill the prescription tomorrow.  We also discussed signs and  symptoms which should cause him to return for further evaluation.  These include development of fever, vomiting, worsening pain, or spread of swelling or redness away from the scrotum.  Patient seems reliable to return if worsening.  Otherwise, patient will need to return in 48 hours or see his doctor for recheck.   Final Clinical Impressions(s) / ED Diagnoses   Final diagnoses:  Cellulitis of scrotum   Patient presents with scrotal swelling.  No signs of torsion, epididymitis, or other problems internally on ultrasound.  Patient does however have a developing scrotal cellulitis.  This remains isolated to the scrotum at this point.  There are no drainable fluid collections at this time.  Patient was given a dose of IV antibiotics.  Leukocytosis noted consistent with infection.  Patient has been afebrile.  He has no history of immune compromise or diabetes.  I do not suspect necrotizing infection or Fournier's gangrene based on exam.  He was monitored in the emergency department for 4 hours without significant progression of his exam.  At this time, we will discharged patient to home on oral antibiotics with close monitoring and follow-up.  He seems reliable to return with any worsening discussed as above.  ED Discharge Orders         Ordered    clindamycin (CLEOCIN) 150 MG capsule  Every 6 hours     07/19/18 1730           Renne CriglerGeiple, Kasyn Stouffer, PA-C 07/19/18 1736    Vanetta MuldersZackowski, Scott, MD 07/22/18 [phone]

## 2018-07-19 NOTE — ED Notes (Signed)
Given soda 

## 2018-07-19 NOTE — ED Notes (Signed)
ED Provider at bedside. 

## 2018-07-19 NOTE — ED Triage Notes (Signed)
Pt c/o swollen testicles with a lump present. Pt states area is painful.

## 2018-07-19 NOTE — ED Notes (Signed)
Patient transported to Ultrasound 

## 2018-07-19 NOTE — Discharge Instructions (Signed)
Please read and follow all provided instructions.  Your diagnoses today include:  1. Cellulitis of scrotum     Tests performed today include:  Vital signs. See below for your results today.   Blood counts and electrolytes - elevated infection fighting cells  Ultrasound -shows normal testes and shows skin infection in the wall of the scrotum  Medications prescribed:   Clindamycin - antibiotic  You have been prescribed an antibiotic medicine: take the entire course of medicine even if you are feeling better. Stopping early can cause the antibiotic not to work.  Take any prescribed medications only as directed.   Home care instructions:  Follow any educational materials contained in this packet. Keep affected area above the level of your heart when possible. Wash area gently twice a day with warm soapy water. Do not apply alcohol or hydrogen peroxide. Cover the area if it draining or weeping.   Follow-up instructions: Return to the Emergency Department in 48 hours for a recheck.  Return instructions:  Return to the Emergency Department if you have:  Fever  Worsening symptoms  Worsening pain  Worsening swelling  Redness of the skin that moves away from the affected area, especially if it streaks away from the affected area   Any other emergent concerns  Your vital signs today were: BP 130/80 (BP Location: Left Arm)    Pulse 94    Temp 99.3 F (37.4 C) (Oral)    Resp 16    Ht 5\' 7"  (1.702 m)    Wt 108.9 kg    SpO2 97%    BMI 37.59 kg/m  If your blood pressure (BP) was elevated above 135/85 this visit, please have this repeated by your doctor within one month. --------------

## 2018-07-20 MED FILL — CLINDAMYCIN HCL 150 MG CAPS: 150 | 7 days supply | Qty: 56 | Fill #0

## 2018-07-22 ENCOUNTER — Emergency Department (HOSPITAL_BASED_OUTPATIENT_CLINIC_OR_DEPARTMENT_OTHER)
Admission: EM | Admit: 2018-07-22 | Discharge: 2018-07-22 | Disposition: A | Discharge status: Home / Self Care | Payer: 59 | Attending: Emergency Medicine | Admitting: Emergency Medicine

## 2018-07-22 ENCOUNTER — Other Ambulatory Visit: Payer: Self-pay

## 2018-07-22 ENCOUNTER — Encounter (HOSPITAL_BASED_OUTPATIENT_CLINIC_OR_DEPARTMENT_OTHER): Payer: Self-pay

## 2018-07-22 DIAGNOSIS — F17210 Nicotine dependence, cigarettes, uncomplicated: Secondary | ICD-10-CM | POA: Insufficient documentation

## 2018-07-22 DIAGNOSIS — N5089 Other specified disorders of the male genital organs: Secondary | ICD-10-CM | POA: Diagnosis present

## 2018-07-22 DIAGNOSIS — N492 Inflammatory disorders of scrotum: Secondary | ICD-10-CM | POA: Insufficient documentation

## 2018-07-22 MED ORDER — LIDOCAINE-EPINEPHRINE (PF) 1 %-1:200000 IJ SOLN
INTRAMUSCULAR | Status: AC
  Administered 2018-07-22: 10 mL
  Filled 2018-07-22: qty 10

## 2018-07-22 MED ORDER — LIDOCAINE-EPINEPHRINE 2 %-1:100000 IJ SOLN
20.0000 mL | Freq: Once | INTRAMUSCULAR | Status: DC
  Filled 2018-07-22: qty 20

## 2018-07-22 MED ORDER — LIDOCAINE-EPINEPHRINE (PF) 2 %-1:200000 IJ SOLN
10.0000 mL | Freq: Once | INTRAMUSCULAR | Status: DC
Start: 2018-07-22 — End: 2018-07-22
  Filled 2018-07-22: qty 10

## 2018-07-22 NOTE — Discharge Instructions (Signed)
Warm compresses or soaks for 4 x a day.  Return or see an urgent care or primary doc in 48 hours.

## 2018-07-22 NOTE — ED Provider Notes (Signed)
[facility] EMERGENCY DEPARTMENT Provider Note   CSN: [id number] Arrival date & time: 07/22/18  1159     History   Chief Complaint Chief Complaint  Patient presents with  . Cellulitis    HPI Chase Chavez is a 32 y.o. male.  32 yo M with a chief complaint of scrotal swelling.  Going on for the past couple days.  Was seen in the ED for this and diagnosed with scrotal cellulitis.  Is been on clindamycin.  He feels that some of that is improved but the localized swelling where it is gotten worse.  He denies drainage.  Temperatures between 99 and 100 at home.  Denies nausea or vomiting.  The history is provided by the patient and the spouse.  Illness  This is a new problem. The current episode started 2 days ago. The problem occurs constantly. The problem has not changed since onset.Pertinent negatives include no chest pain, no abdominal pain, no headaches and no shortness of breath. Nothing aggravates the symptoms. Nothing relieves the symptoms. He has tried nothing for the symptoms. The treatment provided no relief.    History reviewed. No pertinent past medical history.  There are no active problems to display for this patient.   Past Surgical History:  Procedure Laterality Date  . ABDOMINAL SURGERY    . EYE SURGERY    . HAND SURGERY    . TONSILLECTOMY          Home Medications    Prior to Admission medications   Medication Sig Start Date End Date Taking? Authorizing Provider  clindamycin (CLEOCIN) 150 MG capsule Take 2 capsules (300 mg total) by mouth every 6 (six) hours. 07/19/18   Renne Crigler, PA-C  ibuprofen (ADVIL,MOTRIN) 200 MG tablet Take 600-800 mg by mouth every 6 (six) hours as needed for moderate pain.    [provider]    Family History No family history on file.  Social History Social History   Tobacco Use  . Smoking status: Current Every Day Smoker    Types: Cigarettes  . Smokeless tobacco: Never Used  Substance Use  Topics  . Alcohol use: Yes    Comment: occ  . Drug use: No     Allergies   Patient has no known allergies.   Review of Systems Review of Systems  Constitutional: Positive for fever (max 100). Negative for chills.  HENT: Negative for congestion and facial swelling.   Eyes: Negative for discharge and visual disturbance.  Respiratory: Negative for shortness of breath.   Cardiovascular: Negative for chest pain and palpitations.  Gastrointestinal: Negative for abdominal pain, diarrhea and vomiting.  Musculoskeletal: Negative for arthralgias and myalgias.  Skin: Negative for color change and rash.  Neurological: Negative for tremors, syncope and headaches.  Psychiatric/Behavioral: Negative for confusion and dysphoric mood.     Physical Exam Updated Vital Signs BP 132/90   Pulse (!) 103   Temp 98.3 F (36.8 C) (Oral)   Resp 19   SpO2 100%   Physical Exam  Constitutional: He is oriented to person, place, and time. He appears well-developed and well-nourished.  HENT:  Head: Normocephalic and atraumatic.  Eyes: Pupils are equal, round, and reactive to light. EOM are normal.  Neck: Normal range of motion. Neck supple. No JVD present.  Cardiovascular: Normal rate and regular rhythm. Exam reveals no gallop and no friction rub.  No murmur heard. Pulmonary/Chest: No respiratory distress. He has no wheezes.  Abdominal: He exhibits no distension. There is no  rebound and no guarding.  Localized area of swelling to the right base of the scrotum.  There is some fluctuance there.  Some surrounding induration.  Musculoskeletal: Normal range of motion.  Neurological: He is alert and oriented to person, place, and time.  Skin: No rash noted. No pallor.  Psychiatric: He has a normal mood and affect. His behavior is normal.  Nursing note and vitals reviewed.    ED Treatments / Results  Labs (all labs ordered are listed, but only abnormal results are displayed) Labs Reviewed - No data  to display  EKG None  Radiology No results found.  Procedures .Marland Kitchen.Incision and Drainage Date/Time: 07/22/2018 1:23 PM Performed by: Melene PlanFloyd, Eliceo Gladu, DO Authorized by: Melene PlanFloyd, Azka Steger, DO   Consent:    Consent obtained:  Verbal   Consent given by:  Patient   Risks discussed:  Bleeding, incomplete drainage and infection   Alternatives discussed:  No treatment Location:    Type:  Abscess   Location:  Anogenital   Anogenital location:  Scrotal wall Pre-procedure details:    Skin preparation:  Chloraprep Anesthesia (see MAR for exact dosages):    Anesthesia method:  Local infiltration   Local anesthetic:  Lidocaine 2% WITH epi Procedure type:    Complexity:  Complex Procedure details:    Needle aspiration: no     Incision types:  Single straight   Incision depth:  Submucosal   Scalpel blade:  11   Wound management:  Probed and deloculated   Drainage:  Bloody and purulent   Drainage amount:  Copious   Wound treatment:  Wound left open   Packing materials:  None Post-procedure details:    Patient tolerance of procedure:  Tolerated well, no immediate complications   (including critical care time) Emergency Focused Ultrasound Exam Limited Ultrasound of Soft Tissue   Performed and interpreted by Dr. Adela LankFloyd Indication: evaluation for infection or foreign body Transverse and Sagittal views of scrotum are obtained in real time for the purposes of evaluation of skin and underlying soft tissues.  Findings:  heterogeneous fluid collection, with hyperemia/edema of surrounding tissue Interpretation:  abscess, with cellulitis Images archived electronically.  CPT Codes:    Pelvic wall 16109-6076857-26  Discussed smoking cessation with patient and was they were offerred resources to help stop.  Total time was 5 min CPT code [id number].      Medications Ordered in ED Medications  lidocaine-EPINEPHrine (XYLOCAINE W/EPI) 2 %-1:200000 (PF) injection 10 mL (has no administration in time range)    lidocaine-EPINEPHrine (XYLOCAINE-EPINEPHrine) 1 %-1:200000 (PF) injection (10 mLs  Given 07/22/18 1243)     Initial Impression / Assessment and Plan / ED Course  I have reviewed the triage vital signs and the nursing notes.  Pertinent labs & imaging results that were available during my care of the patient were reviewed by me and considered in my medical decision making (see chart for details).     32 yo M here with worsening swelling from an area that was diagnosed with scrotal cellulitis.  Clinically the patient has an abscess and this is confirmed on bedside ultrasound.  Will perform I&D at bedside.  I&D with copious drainage.  PCP follow up.   1:24 PM:  I have discussed the diagnosis/risks/treatment options with the patient and family and believe the pt to be eligible for discharge home to follow-up with PCP. We also discussed returning to the ED immediately if new or worsening sx occur. We discussed the sx which are most concerning (e.g., sudden  worsening pain, fever, inability to tolerate by mouth) that necessitate immediate return. Medications administered to the patient during their visit and any new prescriptions provided to the patient are listed below.  Medications given during this visit Medications  lidocaine-EPINEPHrine (XYLOCAINE W/EPI) 2 %-1:200000 (PF) injection 10 mL (has no administration in time range)  lidocaine-EPINEPHrine (XYLOCAINE-EPINEPHrine) 1 %-1:200000 (PF) injection (10 mLs  Given 07/22/18 1243)      The patient appears reasonably screen and/or stabilized for discharge and I doubt any other medical condition or other [facility] requiring further screening, evaluation, or treatment in the ED at this time prior to discharge.    Final Clinical Impressions(s) / ED Diagnoses   Final diagnoses:  Abscess of scrotal wall    ED Discharge Orders    None       Melene Plan, DO 07/22/18 1324

## 2018-07-22 NOTE — ED Triage Notes (Signed)
Pt dx with cellulitis to scrotum on 9/15-states is no worse-NAD-steady gait

## 2018-07-24 ENCOUNTER — Encounter (HOSPITAL_BASED_OUTPATIENT_CLINIC_OR_DEPARTMENT_OTHER): Payer: Self-pay | Admitting: *Deleted

## 2018-07-24 ENCOUNTER — Other Ambulatory Visit: Payer: Self-pay

## 2018-07-24 ENCOUNTER — Emergency Department (HOSPITAL_BASED_OUTPATIENT_CLINIC_OR_DEPARTMENT_OTHER)
Admission: EM | Admit: 2018-07-24 | Discharge: 2018-07-24 | Disposition: A | Discharge status: Home / Self Care | Payer: 59 | Attending: Emergency Medicine | Admitting: Emergency Medicine

## 2018-07-24 DIAGNOSIS — N492 Inflammatory disorders of scrotum: Secondary | ICD-10-CM | POA: Insufficient documentation

## 2018-07-24 DIAGNOSIS — F17210 Nicotine dependence, cigarettes, uncomplicated: Secondary | ICD-10-CM | POA: Insufficient documentation

## 2018-07-24 DIAGNOSIS — Z09 Encounter for follow-up examination after completed treatment for conditions other than malignant neoplasm: Secondary | ICD-10-CM | POA: Diagnosis not present

## 2018-07-24 NOTE — ED Triage Notes (Signed)
He had an I&D of a scrotal abscess 2 days ago. States he feels like it needs to be re opened. He also has several more hard areas in his scrotum that he also feels are abscessed.

## 2018-07-24 NOTE — ED Provider Notes (Signed)
[facility] EMERGENCY DEPARTMENT Provider Note   CSN: [id number] Arrival date & time: 07/24/18  1112     History   Chief Complaint Chief Complaint  Patient presents with  . Abscess    HPI Chase Chavez is a 32 y.o. male.  Patient currently on clindamycin for scrotal abscess, cellulitis status post incision and drainage.  Patient with overall improved symptoms.  She wanted evaluation to see if anything else needed to be incised.  He states that he is still having some drainage from prior incision site.  No fevers, no chills.  No problems going to the bathroom.  No pain with urination.  The history is provided by the patient.  Illness  This is a recurrent problem. The current episode started more than 2 days ago. The problem occurs constantly. The problem has been gradually improving. Pertinent negatives include no chest pain, no abdominal pain, no headaches and no shortness of breath. Nothing aggravates the symptoms. Relieved by: warm compress, antibiotics. He has tried nothing for the symptoms. The treatment provided moderate relief.    History reviewed. No pertinent past medical history.  There are no active problems to display for this patient.   Past Surgical History:  Procedure Laterality Date  . ABDOMINAL SURGERY    . EYE SURGERY    . HAND SURGERY    . TONSILLECTOMY          Home Medications    Prior to Admission medications   Medication Sig Start Date End Date Taking? Authorizing Provider  clindamycin (CLEOCIN) 150 MG capsule Take 2 capsules (300 mg total) by mouth every 6 (six) hours. 07/19/18   Renne Crigler, PA-C  ibuprofen (ADVIL,MOTRIN) 200 MG tablet Take 600-800 mg by mouth every 6 (six) hours as needed for moderate pain.    [provider]    Family History No family history on file.  Social History Social History   Tobacco Use  . Smoking status: Current Every Day Smoker    Types: Cigarettes  . Smokeless tobacco: Never Used   Substance Use Topics  . Alcohol use: Yes    Comment: occ  . Drug use: No     Allergies   Patient has no known allergies.   Review of Systems Review of Systems  Constitutional: Negative for chills and fever.  HENT: Negative for ear pain and sore throat.   Eyes: Negative for pain and visual disturbance.  Respiratory: Negative for cough and shortness of breath.   Cardiovascular: Negative for chest pain and palpitations.  Gastrointestinal: Negative for abdominal pain and vomiting.  Genitourinary: Positive for scrotal swelling. Negative for decreased urine volume, discharge, dysuria, hematuria, penile pain, penile swelling, testicular pain and urgency.  Musculoskeletal: Negative for arthralgias and back pain.  Skin: Positive for wound. Negative for color change and rash.  Neurological: Negative for seizures, syncope and headaches.  All other systems reviewed and are negative.    Physical Exam Updated Vital Signs  ED Triage Vitals  Enc Vitals Group     BP 07/24/18 1122 138/81     Pulse Rate 07/24/18 1122 88     Resp 07/24/18 1122 18     Temp 07/24/18 1122 98.6 F (37 C)     Temp Source 07/24/18 1122 Oral     SpO2 07/24/18 1122 100 %     Weight 07/24/18 1122 240 lb (108.9 kg)     Height 07/24/18 1122 5\' 7"  (1.702 m)     Head Circumference --  Peak Flow --      Pain Score 07/24/18 1128 3     Pain Loc --      Pain Edu? --      Excl. in GC? --     Physical Exam  Constitutional: He is oriented to person, place, and time. He appears well-developed and well-nourished.  HENT:  Head: Normocephalic and atraumatic.  Eyes: Pupils are equal, round, and reactive to light. Conjunctivae and EOM are normal.  Neck: Normal range of motion. Neck supple.  Cardiovascular: Normal rate, regular rhythm, normal heart sounds and intact distal pulses.  No murmur heard. Pulmonary/Chest: Effort normal and breath sounds normal. No respiratory distress.  Abdominal: Soft. There is no  tenderness.  Genitourinary:  Genitourinary Comments: Scrotal area still with some induration at the base with serosanguineous drainage from prior incision and drainage site, patient with some positive lymph nodes in the right inguinal area, no crepitus, no signs to suggest Fournier's  Musculoskeletal: Normal range of motion. He exhibits no edema.  Neurological: He is alert and oriented to person, place, and time.  Skin: Skin is warm and dry.  Psychiatric: He has a normal mood and affect.  Nursing note and vitals reviewed.    ED Treatments / Results  Labs (all labs ordered are listed, but only abnormal results are displayed) Labs Reviewed - No data to display  EKG None  Radiology No results found.  Procedures Procedures (including critical care time)  Medications Ordered in ED Medications - No data to display   Initial Impression / Assessment and Plan / ED Course  I have reviewed the triage vital signs and the nursing notes.  Pertinent labs & imaging results that were available during my care of the patient were reviewed by me and considered in my medical decision making (see chart for details).     Chase Chavez is a 32 year old male with no significant medical history who presents to the ED for wound recheck.  Patient with normal vitals.  No fever.  Patient currently on clindamycin for scrotal cellulitis/abscess.  Patient had incision and drainage several days ago here in the ED.  He states that overall his symptoms have improved.  He noticed possibly some new area of abscess.  He wanted an evaluation.  Patient still with serosanguineous drainage from prior I&D site.  No signs of obvious cellulitis on exam.  No signs to suggest Fournier's gangrene.  No crepitus.  No pain.  Patient with no obvious fluctuance.  He has some areas of induration from prior abscess site that appears well draining.  Other palpable site appears to be a lymph node.  No need for drainage of this.   Recommend continued course of clindamycin and given information to follow-up with urology.  Recommend continued use of warm compresses and told to return to ED if symptoms worsen.  Discharged in good condition.  This chart was dictated using voice recognition software.  Despite best efforts to proofread,  errors can occur which can change the documentation meaning.   Final Clinical Impressions(s) / ED Diagnoses   Final diagnoses:  Abscess of scrotal wall    ED Discharge Orders    None       Virgina NorfolkCuratolo, Glendine Swetz, DO 07/24/18 1319

## 2018-07-24 NOTE — Discharge Instructions (Addendum)
Continue antibiotics, continue warm compresses, follow-up with urology, return to ED if symptoms worsen.

## 2018-08-13 ENCOUNTER — Encounter (HOSPITAL_BASED_OUTPATIENT_CLINIC_OR_DEPARTMENT_OTHER): Payer: Self-pay | Admitting: *Deleted

## 2018-08-13 ENCOUNTER — Emergency Department (HOSPITAL_BASED_OUTPATIENT_CLINIC_OR_DEPARTMENT_OTHER)
Admission: EM | Admit: 2018-08-13 | Discharge: 2018-08-13 | Disposition: A | Discharge status: Home / Self Care | Payer: 59 | Attending: Emergency Medicine | Admitting: Emergency Medicine

## 2018-08-13 DIAGNOSIS — F17210 Nicotine dependence, cigarettes, uncomplicated: Secondary | ICD-10-CM | POA: Diagnosis not present

## 2018-08-13 DIAGNOSIS — L03011 Cellulitis of right finger: Secondary | ICD-10-CM

## 2018-08-13 DIAGNOSIS — M79644 Pain in right finger(s): Secondary | ICD-10-CM | POA: Diagnosis present

## 2018-08-13 MED ORDER — GI COCKTAIL ~~LOC~~
30.0000 mL | Freq: Once | ORAL | Status: AC
  Administered 2018-08-13: 30 mL via ORAL
  Filled 2018-08-13: qty 30

## 2018-08-13 MED ORDER — SULFAMETHOXAZOLE-TRIMETHOPRIM 800-160 MG PO TABS: 1.0000 | ORAL_TABLET | Freq: Two times a day (BID) | ORAL | 0 refills | Status: DC

## 2018-08-13 MED ORDER — LIDOCAINE HCL 2 % IJ SOLN
20.0000 mL | Freq: Once | INTRAMUSCULAR | Status: AC
  Administered 2018-08-13: 400 mg via INTRADERMAL
  Filled 2018-08-13: qty 20

## 2018-08-13 MED ORDER — SULFAMETHOXAZOLE-TRIMETHOPRIM 800-160 MG PO TABS: 1.0000 | ORAL_TABLET | Freq: Two times a day (BID) | ORAL | 0 refills | Status: AC

## 2018-08-13 MED ORDER — SULFAMETHOXAZOLE-TRIMETHOPRIM 800-160 MG PO TABS
1.0000 | ORAL_TABLET | Freq: Once | ORAL | Status: AC
  Administered 2018-08-13: 1 via ORAL
  Filled 2018-08-13: qty 1

## 2018-08-13 NOTE — ED Provider Notes (Signed)
[facility] EMERGENCY DEPARTMENT Provider Note   CSN: [id number] Arrival date & time: 08/13/18  1912     History   Chief Complaint Chief Complaint  Patient presents with  . Hand Pain    HPI Chase Chavez is a 32 y.o. male.  Patient presents with right middle finger paronychia ongoing for 4 days.  Patient states that he bites his nails.  Patient drained the area at home with a razor blade.  He states that he was able to express a large amount of pus.  Patient with recent scrotal abscess.  No fevers, nausea or vomiting.  Patient complains of a throbbing ache in his finger.  Onset of symptoms acute.  Course is worsening.     History reviewed. No pertinent past medical history.  There are no active problems to display for this patient.   Past Surgical History:  Procedure Laterality Date  . ABDOMINAL SURGERY    . EYE SURGERY    . HAND SURGERY    . TONSILLECTOMY          Home Medications    Prior to Admission medications   Medication Sig Start Date End Date Taking? Authorizing Provider  clindamycin (CLEOCIN) 150 MG capsule Take 2 capsules (300 mg total) by mouth every 6 (six) hours. 07/19/18   Renne Crigler, PA-C  ibuprofen (ADVIL,MOTRIN) 200 MG tablet Take 600-800 mg by mouth every 6 (six) hours as needed for moderate pain.    [provider]    Family History History reviewed. No pertinent family history.  Social History Social History   Tobacco Use  . Smoking status: Current Every Day Smoker    Types: Cigarettes  . Smokeless tobacco: Never Used  Substance Use Topics  . Alcohol use: Yes    Comment: occ  . Drug use: No     Allergies   Patient has no known allergies.   Review of Systems Review of Systems  Constitutional: Negative for fever.  Gastrointestinal: Negative for nausea and vomiting.  Skin: Positive for wound. Negative for color change.       Positive for abscess  Hematological: Negative for adenopathy.      Physical Exam Updated Vital Signs BP (!) 153/86   Pulse 92   Temp 98.2 F (36.8 C) (Oral)   Resp 18   Ht 5\' 7"  (1.702 m)   Wt 108 kg   SpO2 100%   BMI 37.29 kg/m   Physical Exam  Constitutional: He appears well-developed and well-nourished.  HENT:  Head: Normocephalic and atraumatic.  Eyes: Conjunctivae are normal.  Neck: Normal range of motion. Neck supple.  Pulmonary/Chest: No respiratory distress.  Neurological: He is alert.  Skin: Skin is warm and dry.  Right middle finger paronychia with wounds from previous home incision and drainage noted.  Mild surrounding erythema consistent with cellulitis.  No significant felon.  No active drainage or bleeding.  Psychiatric: He has a normal mood and affect.  Nursing note and vitals reviewed.    ED Treatments / Results  Labs (all labs ordered are listed, but only abnormal results are displayed) Labs Reviewed - No data to display  EKG None  Radiology No results found.  Procedures .Marland KitchenIncision and Drainage Date/Time: 08/13/2018 11:49 PM Performed by: Renne Crigler, PA-C Authorized by: Renne Crigler, PA-C   Consent:    Consent obtained:  Verbal   Consent given by:  Patient   Risks discussed:  Pain, bleeding and infection   Alternatives discussed:  No treatment Location:  Type:  Abscess   Location:  Upper extremity   Upper extremity location:  Finger   Finger location:  R long finger Pre-procedure details:    Skin preparation:  Chloraprep Anesthesia (see MAR for exact dosages):    Anesthesia method:  Nerve block   Block needle gauge:  27 G   Block anesthetic:  Lidocaine 2% w/o epi   Block technique:  3 sided ring block   Block injection procedure:  Introduced needle, incremental injection, negative aspiration for blood, anatomic landmarks identified and anatomic landmarks palpated   Block outcome:  Anesthesia achieved Procedure details:    Needle aspiration: yes     Needle size:  22 G   Wound  management:  Probed and deloculated   Drainage:  Purulent   Drainage amount:  Scant   Wound treatment:  Wound left open Post-procedure details:    Patient tolerance of procedure:  Tolerated well, no immediate complications   (including critical care time)  Medications Ordered in ED Medications  lidocaine (XYLOCAINE) 2 % (with pres) injection 400 mg (400 mg Intradermal Given by Other 08/13/18 2149)  sulfamethoxazole-trimethoprim (BACTRIM DS,SEPTRA DS) 800-160 MG per tablet 1 tablet (1 tablet Oral Given 08/13/18 2144)  gi cocktail (Maalox,Lidocaine,Donnatal) (30 mLs Oral Given 08/13/18 2235)     Initial Impression / Assessment and Plan / ED Course  I have reviewed the triage vital signs and the nursing notes.  Pertinent labs & imaging results that were available during my care of the patient were reviewed by me and considered in my medical decision making (see chart for details).     Patient seen and examined.  Patient started on Bactrim.  Vital signs reviewed and are as follows: BP 123/85 (BP Location: Left Arm)   Pulse 96   Temp 98.2 F (36.8 C) (Oral)   Resp 18   Ht 5\' 7"  (1.702 m)   Wt 108 kg   SpO2 96%   BMI 37.29 kg/m   Needle aspiration and digital block performed as above.  I was able to express a small amount of purulent discharge.  The patient was urged to return to the Emergency Department urgently with worsening pain, swelling, expanding erythema especially if it streaks away from the affected area, fever, or if they have any other concerns.   The patient was urged to return to the Emergency Department or go to their PCP in 48 hours for wound recheck if the area is not significantly improved.  The patient verbalized understanding and stated agreement with this plan.     Final Clinical Impressions(s) / ED Diagnoses   Final diagnoses:  Paronychia of finger, right   Patient with active paronychia.  Patient drained most of the pus at home.  Started on  antibiotics.  Home with continued conservative measures, warm soaks, return instructions as above.  ED Discharge Orders         Ordered    sulfamethoxazole-trimethoprim (BACTRIM DS,SEPTRA DS) 800-160 MG tablet  2 times daily,   Status:  Discontinued     08/13/18 2245    sulfamethoxazole-trimethoprim (BACTRIM DS,SEPTRA DS) 800-160 MG tablet  2 times daily     08/13/18 2256           Renne Crigler, PA-C 08/13/18 2352    Alvira Monday, MD 08/14/18 1231

## 2018-08-13 NOTE — ED Triage Notes (Signed)
Pt c/o right middle finger infection w/drainage  X 4 days

## 2018-08-13 NOTE — ED Notes (Signed)
Pt verbalizes understanding of d/c instructions and denies any further needs at this time. 

## 2018-08-13 NOTE — Discharge Instructions (Signed)
Please read and follow all provided instructions.  Your diagnoses today include:  1. Paronychia of finger, right     Tests performed today include:  Vital signs. See below for your results today.   Medications prescribed:   Bactrim (trimethoprim/sulfamethoxazole) - antibiotic  You have been prescribed an antibiotic medicine: take the entire course of medicine even if you are feeling better. Stopping early can cause the antibiotic not to work.  Take any prescribed medications only as directed.   Home care instructions:   Follow any educational materials contained in this packet  Follow-up instructions: Return to the Emergency Department in 48 hours for a recheck if your symptoms are not significantly improved.  Return instructions:  Return to the Emergency Department if you have:  Fever  Worsening symptoms  Worsening pain  Worsening swelling  Redness of the skin that moves away from the affected area, especially if it streaks away from the affected area   Any other emergent concerns  Additional Information: If you have recurrent abscesses, try both the following. Use a Qtip to apply an over-the-counter antibiotic to the inside of your nostrils, twice a day for 5 days. Wash your body with over-the-counter Hibaclens once a day for one week and then once every two weeks. This can reduce the amount of bacterial on your skin that causes boils and lead to fewer boils. If you continue to have multiple or recurrent boils, you should see a dermatologist (skin doctor).   Your vital signs today were: BP (!) 153/86    Pulse 92    Temp 98.2 F (36.8 C) (Oral)    Resp 18    Ht 5\' 7"  (1.702 m)    Wt 108 kg    SpO2 100%    BMI 37.29 kg/m  If your blood pressure (BP) was elevated above 135/85 this visit, please have this repeated by your doctor within one month. --------------

## 2019-03-25 ENCOUNTER — Emergency Department (HOSPITAL_BASED_OUTPATIENT_CLINIC_OR_DEPARTMENT_OTHER): Payer: 59

## 2019-03-25 ENCOUNTER — Other Ambulatory Visit: Payer: Self-pay | Admitting: Family Medicine

## 2019-03-25 ENCOUNTER — Other Ambulatory Visit: Payer: Self-pay

## 2019-03-25 ENCOUNTER — Ambulatory Visit (HOSPITAL_BASED_OUTPATIENT_CLINIC_OR_DEPARTMENT_OTHER)
Admission: RE | Admit: 2019-03-25 | Discharge: 2019-03-25 | Disposition: A | Discharge status: Home / Self Care | Payer: 59 | Source: Ambulatory Visit | Attending: Family Medicine | Admitting: Family Medicine

## 2019-03-25 ENCOUNTER — Encounter (HOSPITAL_BASED_OUTPATIENT_CLINIC_OR_DEPARTMENT_OTHER): Payer: Self-pay | Admitting: Emergency Medicine

## 2019-03-25 ENCOUNTER — Encounter: Payer: Self-pay | Admitting: Family Medicine

## 2019-03-25 ENCOUNTER — Emergency Department (HOSPITAL_BASED_OUTPATIENT_CLINIC_OR_DEPARTMENT_OTHER)
Admission: EM | Admit: 2019-03-25 | Discharge: 2019-03-25 | Disposition: A | Discharge status: Home / Self Care | Payer: 59 | Attending: Emergency Medicine | Admitting: Emergency Medicine

## 2019-03-25 ENCOUNTER — Ambulatory Visit: Payer: 59 | Admitting: Family Medicine

## 2019-03-25 VITALS — BP 138/81 | HR 90 | Ht 67.0 in | Wt 250.0 lb

## 2019-03-25 DIAGNOSIS — M79672 Pain in left foot: Secondary | ICD-10-CM | POA: Diagnosis present

## 2019-03-25 DIAGNOSIS — Z87891 Personal history of nicotine dependence: Secondary | ICD-10-CM | POA: Insufficient documentation

## 2019-03-25 DIAGNOSIS — Z79899 Other long term (current) drug therapy: Secondary | ICD-10-CM | POA: Diagnosis not present

## 2019-03-25 DIAGNOSIS — G8929 Other chronic pain: Secondary | ICD-10-CM

## 2019-03-25 DIAGNOSIS — M25572 Pain in left ankle and joints of left foot: Secondary | ICD-10-CM

## 2019-03-25 DIAGNOSIS — M93272 Osteochondritis dissecans, left ankle and joints of left foot: Secondary | ICD-10-CM | POA: Insufficient documentation

## 2019-03-25 MED ORDER — DICLOFENAC SODIUM 2 % TD SOLN: 1.0000 "application " | Freq: Two times a day (BID) | TRANSDERMAL | 2 refills | Status: DC

## 2019-03-25 MED ORDER — NAPROXEN 500 MG PO TABS: 500.0000 mg | ORAL_TABLET | Freq: Two times a day (BID) | ORAL | 0 refills | Status: DC

## 2019-03-25 NOTE — Assessment & Plan Note (Addendum)
Initially injury was in 2013. 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 Pain has been ongoing. Avoided surgery due to work and was not able to take prolonged time off. Pain is likely related to his OCD lesion - xray ankle - pennsaid  - vitamin D  - start vitamin k2.  - counseled on compression and supportive care - if no improvement may need to updated MRI. Could try PRP or steroid injection.

## 2019-03-25 NOTE — Patient Instructions (Signed)
Nice to meet you Please try vitamin K2 which is over the counter  Please try the anti-inflammatory and tylenol for pain  Please try the body helix  Please let me know about the Platelet Rich plasma or PRP. This is not covered by insurance. Roughly $475.  Please send me a message in MyChart with any questions or updates.  Please see me back in 4 weeks I will call you with the results from today.   --Dr. Jordan Likes

## 2019-03-25 NOTE — ED Notes (Signed)
Pt verbalized understanding of dc instructions.

## 2019-03-25 NOTE — ED Triage Notes (Signed)
C/o left ankle pain worse last 2 weeks,  Had inj 5 years ago and has pain off and on since,  Has had mri and PT for same

## 2019-03-25 NOTE — Discharge Instructions (Addendum)
Medications as needed for pain, use the splint to help support your ankle as you walk around, follow-up with a sports medicine doctor or orthopedic doctor for further evaluation

## 2019-03-25 NOTE — Progress Notes (Signed)
Chase Chavez - 34 y.o. male MRN [id number]  Date of birth: 1986-01-02  SUBJECTIVE:  Including CC & ROS.  Chief Complaint  Patient presents with  . Ankle Pain    left ankle    Chase Chavez is a 33 y.o. male that is  Presenting with acute on chronic left ankle pain. He initially hurt his ankle in 2013. He underwent a work up in 2016. He had an MRI, has completed PT and has taken different medications. He is currently wearing a lace up ankle brace. His ankle started hurting recently. About a 1.5 weeks ago. Denies any recent injury or inciting event.  The pain is moderate to severe. Pain has been more constant. Pain is lateral and posterior. Has limited range of motion. .  Independent review of the left foot x-ray from 5/21 shows no significant bony abnormalities.  Review of the MRI from 2016 shows an osteochondral lesion at the lateral talar dome that is roughly 11 mm x 6 mm with subchondral fracture line and surrounding marrow edema.   Review of Systems  Constitutional: Negative for fever.  HENT: Negative for congestion.   Respiratory: Negative for cough.   Cardiovascular: Negative for chest pain.  Gastrointestinal: Negative for abdominal pain.  Musculoskeletal: Positive for arthralgias and joint swelling.  Skin: Negative for color change.  Neurological: Negative for weakness.  Hematological: Negative for adenopathy.    HISTORY: Past Medical, Surgical, Social, and Family History Reviewed & Updated per EMR.   Pertinent Historical Findings include:  No past medical history on file.  Past Surgical History:  Procedure Laterality Date  . ABDOMINAL SURGERY    . EYE SURGERY    . HAND SURGERY    . TONSILLECTOMY      No Known Allergies  No family history on file.   Social History   Socioeconomic History  . Marital status: Single    Spouse name: Not on file  . Number of children: Not on file  . Years of education: Not on file  . Highest education level: Not on file   Occupational History  . Not on file  Social Needs  . Financial resource strain: Not on file  . Food insecurity:    Worry: Not on file    Inability: Not on file  . Transportation needs:    Medical: Not on file    Non-medical: Not on file  Tobacco Use  . Smoking status: Former Smoker    Types: Cigarettes  . Smokeless tobacco: Never Used  Substance and Sexual Activity  . Alcohol use: Yes    Comment: occ  . Drug use: No  . Sexual activity: Not on file  Lifestyle  . Physical activity:    Days per week: Not on file    Minutes per session: Not on file  . Stress: Not on file  Relationships  . Social connections:    Talks on phone: Not on file    Gets together: Not on file    Attends religious service: Not on file    Active member of club or organization: Not on file    Attends meetings of clubs or organizations: Not on file    Relationship status: Not on file  . Intimate partner violence:    Fear of current or ex partner: Not on file    Emotionally abused: Not on file    Physically abused: Not on file    Forced sexual activity: Not on file  Other Topics Concern  . Not  on file  Social History Narrative  . Not on file     PHYSICAL EXAM:  VS: BP 138/81   Pulse 90   Ht 5\' 7"  (1.702 m)   Wt 250 lb (113.4 kg)   BMI 39.16 kg/m  Physical Exam Gen: NAD, alert, cooperative with exam, well-appearing ENT: normal lips, normal nasal mucosa,  Eye: normal EOM, normal conjunctiva and lids CV:  no edema, +2 pedal pulses   Resp: no accessory muscle use, non-labored,   Skin: no rashes, no areas of induration  Neuro: normal tone, normal sensation to touch Psych:  normal insight, alert and oriented MSK:  Left ankle:  TTP along the posterior and lateral joint line.  Limited dorsiflexion  Normal strength to resistance  Neurovascularly intact       ASSESSMENT & PLAN:   Chronic pain of left ankle Initially injury was in 2013. 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 Pain has been  ongoing. Avoided surgery due to work and was not able to take prolonged time off. Pain is likely related to his OCD lesion - xray ankle - pennsaid  - vitamin D  - start vitamin k2.  - counseled on compression and supportive care - if no improvement may need to updated MRI. Could try PRP or steroid injection.

## 2019-03-25 NOTE — ED Provider Notes (Signed)
[facility] EMERGENCY DEPARTMENT Provider Note   CSN: [id number] Arrival date & time: 03/25/19  7425    History   Chief Complaint Chief Complaint  Patient presents with  . Ankle Pain    HPI Chase Chavez is a 33 y.o. male.     HPI Patient presents to the emergency room for evaluation of left ankle/foot pain.  Patient states he initially injured his ankle on the job several years ago.  He was told there was some cartilage injury.  Patient never had to have surgery.  Intermittently he will have exacerbations of pain ever since then.  Last couple of weeks he has had increasing pain in that same area primarily the posterior aspect of his left heel.  He feels like something will pop intermittently.  The pain has been getting worse.  It hurts for him to walk.  He denies any fevers or chills.  No recent falls or injury.  Patient has not seen anyone for this since that initial evaluation where he did see an orthopedic doctor and had an MRI.  Outside records reviewed from August 2016 when the patient had an MRI.  It showed a lateral talar dome osteochondral lesion with subchondral fracture line and surrounding marrow edema. History reviewed. No pertinent past medical history.  There are no active problems to display for this patient.   Past Surgical History:  Procedure Laterality Date  . ABDOMINAL SURGERY    . EYE SURGERY    . HAND SURGERY    . TONSILLECTOMY          Home Medications    Prior to Admission medications   Medication Sig Start Date End Date Taking? Authorizing Provider  clindamycin (CLEOCIN) 150 MG capsule Take 2 capsules (300 mg total) by mouth every 6 (six) hours. 07/19/18   Renne Crigler, PA-C  ibuprofen (ADVIL,MOTRIN) 200 MG tablet Take 600-800 mg by mouth every 6 (six) hours as needed for moderate pain.    [provider]  naproxen (NAPROSYN) 500 MG tablet Take 1 tablet (500 mg total) by mouth 2 (two) times daily with a meal. As needed for  pain 03/25/19   Linwood Dibbles, MD    Family History No family history on file.  Social History Social History   Tobacco Use  . Smoking status: Former Smoker    Types: Cigarettes  . Smokeless tobacco: Never Used  Substance Use Topics  . Alcohol use: Yes    Comment: occ  . Drug use: No     Allergies   Patient has no known allergies.   Review of Systems Review of Systems  All other systems reviewed and are negative.    Physical Exam Updated Vital Signs BP (!) 153/73 (BP Location: Right Arm)   Pulse 92   Temp 98.4 F (36.9 C) (Oral)   Resp 16   Ht 1.702 m (5\' 7" )   Wt 113.4 kg   SpO2 98%   BMI 39.16 kg/m   Physical Exam Vitals signs and nursing note reviewed.  Constitutional:      General: He is not in acute distress.    Appearance: He is well-developed.  HENT:     Head: Normocephalic and atraumatic.     Right Ear: External ear normal.     Left Ear: External ear normal.  Eyes:     General: No scleral icterus.       Right eye: No discharge.        Left eye: No discharge.  Conjunctiva/sclera: Conjunctivae normal.  Neck:     Musculoskeletal: Neck supple.     Trachea: No tracheal deviation.  Cardiovascular:     Rate and Rhythm: Normal rate.  Pulmonary:     Effort: Pulmonary effort is normal. No respiratory distress.     Breath sounds: No stridor.  Abdominal:     General: There is no distension.  Musculoskeletal:        General: No swelling or deformity.     Comments: Tender to outpatient posterior aspect of the left heel , no surrounding erythema or edema, no tenderness palpation medial lateral malleolus, distal sensation and perfusion intact  Skin:    General: Skin is warm and dry.     Findings: No rash.  Neurological:     Mental Status: He is alert.     Cranial Nerves: Cranial nerve deficit: no gross deficits.      ED Treatments / Results   Radiology Dg Foot Complete Left  Result Date: 03/25/2019 CLINICAL DATA:  Persistent heel pain EXAM:  LEFT FOOT - COMPLETE 3+ VIEW COMPARISON:  None. FINDINGS: There is no evidence of fracture or dislocation. There is no evidence of arthropathy or other focal bone abnormality. Soft tissues are unremarkable. IMPRESSION: Negative. Electronically Signed   By: Judie PetitM.  Shick M.D.   On: 03/25/2019 09:06    Procedures Procedures (including critical care time)  Medications Ordered in ED Medications - No data to display   Initial Impression / Assessment and Plan / ED Course  I have reviewed the triage vital signs and the nursing notes.  Pertinent labs & imaging results that were available during my care of the patient were reviewed by me and considered in my medical decision making (see chart for details).      X-rays are unremarkable.  No sign of fracture or bony destruction.  Previous MRI did show abnormalities.  Patient did not have any follow-up treatment but he has done reasonably well for several years.  At this time there is no evidence of any emergency medical condition.  I recommend outpatient follow-up with orthopedics or sports medicine.  Final Clinical Impressions(s) / ED Diagnoses   Final diagnoses:  Heel pain, chronic, left    ED Discharge Orders         Ordered    naproxen (NAPROSYN) 500 MG tablet  2 times daily with meals     03/25/19 0935           Linwood DibblesKnapp, Detra Bores, MD 03/25/19 [phone]

## 2019-03-26 ENCOUNTER — Telehealth: Payer: Self-pay | Admitting: Family Medicine

## 2019-03-26 LAB — VITAMIN D 25 HYDROXY (VIT D DEFICIENCY, FRACTURES): Vit D, 25-Hydroxy: 30 ng/mL (ref 30.0–100.0)

## 2019-03-26 NOTE — Telephone Encounter (Signed)
Left VM for patient. If he calls back please have him speak with a nurse/CMA and inform that his xray doesn't show any loose bodies or any advanced arthritis. Doesn't show the defect but that's to be expected. The [facility] can report results to patient.   If any questions then please take the best time and phone number to call and I will try to call him back.   Myra Rude, MD [facility] 03/26/2019, 8:47 AM

## 2019-03-26 NOTE — Telephone Encounter (Signed)
Pt returned vm for lab/xray results. Nurse triage unavailable. Please return pt's call. JA#[phone]

## 2019-03-30 MED ORDER — VITAMIN D (ERGOCALCIFEROL) 1.25 MG (50000 UNIT) PO CAPS: 50000.0000 [IU] | ORAL_CAPSULE | ORAL | 0 refills | Status: DC

## 2019-03-30 NOTE — Telephone Encounter (Signed)
Spoke with patient about results.   Myra Rude, MD [facility] 03/30/2019, 8:31 AM

## 2019-03-30 NOTE — Addendum Note (Signed)
Addended by: Myra Rude on: 03/30/2019 08:31 AM   Modules accepted: Orders

## 2019-04-09 ENCOUNTER — Encounter (HOSPITAL_BASED_OUTPATIENT_CLINIC_OR_DEPARTMENT_OTHER): Payer: Self-pay | Admitting: *Deleted

## 2019-04-09 ENCOUNTER — Emergency Department (HOSPITAL_BASED_OUTPATIENT_CLINIC_OR_DEPARTMENT_OTHER)
Admission: EM | Admit: 2019-04-09 | Discharge: 2019-04-09 | Disposition: A | Discharge status: Home / Self Care | Payer: 59 | Attending: Emergency Medicine | Admitting: Emergency Medicine

## 2019-04-09 ENCOUNTER — Other Ambulatory Visit: Payer: Self-pay

## 2019-04-09 DIAGNOSIS — Z87891 Personal history of nicotine dependence: Secondary | ICD-10-CM | POA: Insufficient documentation

## 2019-04-09 DIAGNOSIS — H5713 Ocular pain, bilateral: Secondary | ICD-10-CM | POA: Diagnosis present

## 2019-04-09 DIAGNOSIS — H169 Unspecified keratitis: Secondary | ICD-10-CM | POA: Insufficient documentation

## 2019-04-09 MED ORDER — FLUORESCEIN SODIUM 1 MG OP STRP
1.0000 | ORAL_STRIP | Freq: Once | OPHTHALMIC | Status: AC
  Administered 2019-04-09: 1 via OPHTHALMIC
  Filled 2019-04-09: qty 1

## 2019-04-09 MED ORDER — TETRACAINE HCL 0.5 % OP SOLN
2.0000 [drp] | Freq: Once | OPHTHALMIC | Status: AC
  Administered 2019-04-09: 2 [drp] via OPHTHALMIC
  Filled 2019-04-09: qty 4

## 2019-04-09 MED ORDER — LEVOFLOXACIN 0.5 % OP SOLN: 2.0000 [drp] | OPHTHALMIC | 0 refills | Status: AC

## 2019-04-09 NOTE — Discharge Instructions (Signed)
You were seen in the emergency department today with inflammation of the eye after leaving her contacts in place.  Please use the antibiotic drops as prescribed and take the full course.  Call the ophthalmologist first thing on Monday to schedule an urgent follow-up appointment, preferably same day, for reevaluation of your eyes.  Return to the emergency department immediately if you develop any new or worsening symptoms over the weekend.

## 2019-04-09 NOTE — ED Provider Notes (Signed)
Emergency Department Provider Note   I have reviewed the triage vital signs and the nursing notes.   HISTORY  Chief Complaint Eye Problem   HPI Chase Chavez is a 33 y.o. male presents to the ED with bilateral eye pain and discharge. No vision change. He habitually sleeps in his contacts. He has 30 day contacts and routinely leaves these in for 45 days at a time without removing them. Several days prior he developed severe pain and discharge. No change in vision. No pain with EOM. No fever or HA. No radiation or symptoms or modifying factors.   History reviewed. No pertinent past medical history.  Patient Active Problem List   Diagnosis Date Noted  . Chronic pain of left ankle 03/25/2019    Past Surgical History:  Procedure Laterality Date  . ABDOMINAL SURGERY    . EYE SURGERY    . HAND SURGERY    . TONSILLECTOMY      Allergies Patient has no known allergies.  History reviewed. No pertinent family history.  Social History Social History   Tobacco Use  . Smoking status: Former Smoker    Types: Cigarettes  . Smokeless tobacco: Never Used  Substance Use Topics  . Alcohol use: Yes    Comment: occ  . Drug use: No    Review of Systems  Constitutional: No fever/chills Eyes: No visual changes. Positive bilateral eye pain and discharge.  ENT: No sore throat. Cardiovascular: Denies chest pain. Respiratory: Denies shortness of breath. Gastrointestinal: No abdominal pain.  No nausea, no vomiting.  No diarrhea.  No constipation. Genitourinary: Negative for dysuria. Musculoskeletal: Negative for back pain. Skin: Negative for rash. Neurological: Negative for headaches, focal weakness or numbness.  10-point ROS otherwise negative.  ____________________________________________   PHYSICAL EXAM:  VITAL SIGNS: ED Triage Vitals  Enc Vitals Group     BP --      Pulse Rate 04/09/19 2124 100     Resp 04/09/19 2124 16     Temp 04/09/19 2124 98.2 F (36.8 C)     Temp src --      SpO2 04/09/19 2124 100 %     Weight 04/09/19 2125 249 lb 1.9 oz (113 kg)   Constitutional: Alert and oriented. Well appearing and in no acute distress. Eyes: Conjunctivae are injected bilaterally with discharge. EOMI without pain. PERRL. Visual acuity normal with glasses. Very mild diffuse dye uptake over corneas bilaterally. No focal ulcerations.  Head: Atraumatic. Nose: No congestion/rhinnorhea. Mouth/Throat: Mucous membranes are moist.  Neck: No stridor.   Cardiovascular: Normal rate, regular rhythm.   Respiratory: Normal respiratory effort.   Gastrointestinal:  No distention.  Musculoskeletal:  No gross deformities of extremities. Neurologic:  Normal speech and language.  Skin:  Skin is warm, dry and intact. No rash noted.  ____________________________________________   PROCEDURES  Procedure(s) performed:   Procedures  None ____________________________________________   INITIAL IMPRESSION / ASSESSMENT AND PLAN / ED COURSE  Pertinent labs & imaging results that were available during my care of the patient were reviewed by me and considered in my medical decision making (see chart for details).   Patient with keratitis 2/2 leaving his contacts in for many days. No focal ulcerations. Covering with abx and advised very close f/u with ophthalmology. Will call first thing on Monday.    ____________________________________________  FINAL CLINICAL IMPRESSION(S) / ED DIAGNOSES  Final diagnoses:  Keratitis     MEDICATIONS GIVEN DURING THIS VISIT:  Medications  tetracaine (PONTOCAINE) 0.5 % ophthalmic solution  2 drop (2 drops Both Eyes Given 04/09/19 2222)  fluorescein ophthalmic strip 1 strip (1 strip Both Eyes Given 04/09/19 2223)     NEW OUTPATIENT MEDICATIONS STARTED DURING THIS VISIT:  Discharge Medication List as of 04/09/2019 10:18 PM    START taking these medications   Details  levofloxacin (QUIXIN) 0.5 % ophthalmic solution Place 2 drops into  both eyes every 2 (two) hours for 2 days. Then 2 drops in each eye every 6 hours for the next 5 days., Starting Fri 04/09/2019, Until Sun 04/11/2019, Print        Note:  This document was prepared using Dragon voice recognition software and may include unintentional dictation errors.  Alona Bene, MD Emergency Medicine    Algis Lehenbauer, Arlyss Repress, MD 04/12/19 [phone]

## 2019-04-09 NOTE — ED Triage Notes (Addendum)
Pt c/o bil eye drainage and redness , pt states he sleeps in his contacts

## 2019-04-12 ENCOUNTER — Telehealth (HOSPITAL_BASED_OUTPATIENT_CLINIC_OR_DEPARTMENT_OTHER): Payer: Self-pay | Admitting: Emergency Medicine

## 2019-04-22 ENCOUNTER — Ambulatory Visit (INDEPENDENT_AMBULATORY_CARE_PROVIDER_SITE_OTHER): Payer: 59 | Admitting: Family Medicine

## 2019-04-22 ENCOUNTER — Other Ambulatory Visit: Payer: Self-pay

## 2019-04-22 ENCOUNTER — Encounter: Payer: Self-pay | Admitting: Family Medicine

## 2019-04-22 DIAGNOSIS — M93272 Osteochondritis dissecans, left ankle and joints of left foot: Secondary | ICD-10-CM

## 2019-04-22 NOTE — Progress Notes (Signed)
Chase Chavez - 33 y.o. male MRN [id number]  Date of birth: 04/05/1986  SUBJECTIVE:  Including CC & ROS.  Chief Complaint  Patient presents with  . Follow-up    follow up for left heel    Chase Chavez is a 33 y.o. male that is following up for his left ankle pain.  He reports his pain is intermittent in nature.  He can have no pain at times to other times aches being excruciating.  Pain is localized to the ankle.  He is unsure of the specific trigger the cause of the pain.  He has use medications as needed.  Denies any other inciting event or trauma.  He is able to work and walk normally.  Denies any mechanical symptoms.  Independent review of the left ankle x-Chase from 5/21 shows no significant degenerative changes.   Review of Systems  Constitutional: Negative for fever.  HENT: Negative for congestion.   Respiratory: Negative for cough.   Cardiovascular: Negative for chest pain.  Gastrointestinal: Negative for abdominal pain.  Musculoskeletal: Positive for joint swelling.  Skin: Negative for color change.  Neurological: Negative for weakness.  Hematological: Negative for adenopathy.    HISTORY: Past Medical, Surgical, Social, and Family History Reviewed & Updated per EMR.   Pertinent Historical Findings include:  No past medical history on file.  Past Surgical History:  Procedure Laterality Date  . ABDOMINAL SURGERY    . EYE SURGERY    . HAND SURGERY    . TONSILLECTOMY      No Known Allergies  No family history on file.   Social History   Socioeconomic History  . Marital status: Single    Spouse name: Not on file  . Number of children: Not on file  . Years of education: Not on file  . Highest education level: Not on file  Occupational History  . Not on file  Social Needs  . Financial resource strain: Not on file  . Food insecurity    Worry: Not on file    Inability: Not on file  . Transportation needs    Medical: Not on file    Non-medical: Not on  file  Tobacco Use  . Smoking status: Former Smoker    Types: Cigarettes  . Smokeless tobacco: Never Used  Substance and Sexual Activity  . Alcohol use: Yes    Comment: occ  . Drug use: No  . Sexual activity: Not on file  Lifestyle  . Physical activity    Days per week: Not on file    Minutes per session: Not on file  . Stress: Not on file  Relationships  . Social Musicianconnections    Talks on phone: Not on file    Gets together: Not on file    Attends religious service: Not on file    Active member of club or organization: Not on file    Attends meetings of clubs or organizations: Not on file    Relationship status: Not on file  . Intimate partner violence    Fear of current or ex partner: Not on file    Emotionally abused: Not on file    Physically abused: Not on file    Forced sexual activity: Not on file  Other Topics Concern  . Not on file  Social History Narrative  . Not on file     PHYSICAL EXAM:  VS: BP 137/79   Pulse 96   Ht 5\' 7"  (1.702 m)   Wt 260 lb (  117.9 kg)   BMI 40.72 kg/m  Physical Exam Gen: NAD, alert, cooperative with exam, well-appearing ENT: normal lips, normal nasal mucosa,  Eye: normal EOM, normal conjunctiva and lids CV:  no edema, +2 pedal pulses   Resp: no accessory muscle use, non-labored,  Skin: no rashes, no areas of induration  Neuro: normal tone, normal sensation to touch Psych:  normal insight, alert and oriented MSK:  Left ankle: Normal range of motion. Normal strength resistance. Normal gait. Neurovascular intact     ASSESSMENT & PLAN:   Osteochondritis dissecans of ankle, left Pain is intermittent and not severe at this time.  No mechanical symptoms. -Counseled on supportive care. -Can continue to take over-the-counter supplements. -Counseled on anti-inflammatories as needed. -If becomes worse may need to try injections versus MRI.

## 2019-04-22 NOTE — Assessment & Plan Note (Signed)
Pain is intermittent and not severe at this time.  No mechanical symptoms. -Counseled on supportive care. -Can continue to take over-the-counter supplements. -Counseled on anti-inflammatories as needed. -If becomes worse may need to try injections versus MRI.

## 2019-04-22 NOTE — Patient Instructions (Signed)
Good to see you Please ice when needed  Please try to elevate at the end of the day.   Please send me a message in MyChart with any questions or updates.  Please see me back if you feel the pain is becoming more frequent or if your ankle gets stuck in one position.   --Dr. Raeford Razor

## 2019-04-23 ENCOUNTER — Encounter

## 2019-04-28 ENCOUNTER — Other Ambulatory Visit: Payer: Self-pay | Admitting: Family Medicine

## 2019-06-27 ENCOUNTER — Other Ambulatory Visit: Payer: Self-pay | Admitting: Family Medicine

## 2020-01-02 ENCOUNTER — Emergency Department (HOSPITAL_BASED_OUTPATIENT_CLINIC_OR_DEPARTMENT_OTHER)
Admission: EM | Admit: 2020-01-02 | Discharge: 2020-01-02 | Disposition: A | Discharge status: Home / Self Care | Payer: 59 | Attending: Emergency Medicine | Admitting: Emergency Medicine

## 2020-01-02 ENCOUNTER — Encounter (HOSPITAL_BASED_OUTPATIENT_CLINIC_OR_DEPARTMENT_OTHER): Payer: Self-pay

## 2020-01-02 ENCOUNTER — Emergency Department (HOSPITAL_BASED_OUTPATIENT_CLINIC_OR_DEPARTMENT_OTHER): Payer: 59

## 2020-01-02 ENCOUNTER — Other Ambulatory Visit: Payer: Self-pay

## 2020-01-02 DIAGNOSIS — Y999 Unspecified external cause status: Secondary | ICD-10-CM | POA: Insufficient documentation

## 2020-01-02 DIAGNOSIS — W272XXA Contact with scissors, initial encounter: Secondary | ICD-10-CM | POA: Diagnosis not present

## 2020-01-02 DIAGNOSIS — Y929 Unspecified place or not applicable: Secondary | ICD-10-CM | POA: Diagnosis not present

## 2020-01-02 DIAGNOSIS — S61432A Puncture wound without foreign body of left hand, initial encounter: Secondary | ICD-10-CM | POA: Diagnosis present

## 2020-01-02 DIAGNOSIS — Z87891 Personal history of nicotine dependence: Secondary | ICD-10-CM | POA: Insufficient documentation

## 2020-01-02 DIAGNOSIS — Y9389 Activity, other specified: Secondary | ICD-10-CM | POA: Insufficient documentation

## 2020-01-02 HISTORY — DX: Gastro-esophageal reflux disease without esophagitis: K21.9

## 2020-01-02 HISTORY — DX: Attention-deficit hyperactivity disorder, unspecified type: F90.9

## 2020-01-02 MED ORDER — AMOXICILLIN-POT CLAVULANATE 875-125 MG PO TABS: 1.0000 | ORAL_TABLET | Freq: Two times a day (BID) | ORAL | 0 refills | Status: AC

## 2020-01-02 NOTE — Discharge Instructions (Signed)
You were seen in the emergency department today with a puncture wound to the left hand.  Your x-ray was normal.  I am covering you with antibiotics to prevent infection.  Return to the emergency department immediately if you develop and redness, warmth, drainage, fever.  Please follow-up with a hand surgeon.  You can call tomorrow to schedule the next available appointment.  I would like for them to evaluate you after your swelling is gone down.

## 2020-01-02 NOTE — ED Triage Notes (Signed)
Pt unintentionally stabbed some scissors through his L hand. Small 1 cm puncture wound noted.

## 2020-01-02 NOTE — ED Provider Notes (Signed)
Emergency Department Provider Note   I have reviewed the triage vital signs and the nursing notes.   HISTORY  Chief Complaint Hand Injury   HPI Chase Chavez is a 34 y.o. male presents to the ED with left hand puncture wound in the first web space. He was attempting to use scissors to open a bottle of lotion when he inadvertently stabbed himself in the hand. He reports swelling and tightness in the area without numbness. He able to move all fingers and thumb but reports "tightness" feeling like a slight weakness. Last tetanus 2016. No other injuries.    Past Medical History:  Diagnosis Date  . ADHD   . GERD (gastroesophageal reflux disease)     Patient Active Problem List   Diagnosis Date Noted  . 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 ABDOMINAL SURGERY    . EYE SURGERY    . HAND SURGERY    . TONSILLECTOMY      Allergies Patient has no known allergies.  No family history on file.  Social History Social History   Tobacco Use  . Smoking status: Former Smoker    Types: Cigarettes  . Smokeless tobacco: Never Used  Substance Use Topics  . Alcohol use: Yes    Comment: occ  . Drug use: No    Review of Systems  Constitutional: No fever/chills Skin: Puncture wound to the left hand.  Neurological: Negative for focal weakness or numbness.  10-point ROS otherwise negative.  ____________________________________________   PHYSICAL EXAM:  VITAL SIGNS: ED Triage Vitals  Enc Vitals Group     BP 01/02/20 2045 (!) 144/79     Pulse Rate 01/02/20 2045 84     Resp 01/02/20 2045 16     Temp 01/02/20 2045 98.4 F (36.9 C)     Temp Source 01/02/20 2045 Oral     SpO2 01/02/20 2045 100 %     Weight 01/02/20 2049 300 lb (136.1 kg)     Height 01/02/20 2048 5\' 7"  (1.702 m)   Constitutional: Alert and oriented. Well appearing and in no acute distress. Eyes: Conjunctivae are normal. Head:  Atraumatic. Nose: No congestion/rhinnorhea. Neck: No stridor.  Cardiovascular: Good peripheral circulation including peripheral pulses and capillary refill in the left hand.    Respiratory: Normal respiratory effort. Gastrointestinal: No distention.  Musculoskeletal: Normal ROM of the left thumb, fingers, and wrist. Flexor/extensor tendons assessed in isolation without focal weakness. Edema with 1 cm puncture wound to the left 1st web space. Wound probed with adipose but no visible muscle or tendon. No active bleeding.  Neurologic:  Normal speech and language. No gross focal neurologic deficits are appreciated.  Skin:  Skin is warm and dry. 1 cm puncture wound as above.   ____________________________________________  RADIOLOGY  DG Hand Complete Left  Result Date: 01/02/2020 CLINICAL DATA:  Pain EXAM: LEFT HAND - COMPLETE 3+ VIEW COMPARISON:  None. FINDINGS: There is extensive soft tissue swelling about the first webspace of the left hand. There is no acute displaced fracture or dislocation. No radiopaque foreign body. IMPRESSION: Negative. Electronically Signed   By: 01/04/2020 M.D.   On: 01/02/2020 21:21    ____________________________________________   PROCEDURES  Procedure(s) performed:   Procedures  None ____________________________________________   INITIAL IMPRESSION / ASSESSMENT AND PLAN / ED COURSE  Pertinent labs & imaging results that were available during my care of the patient were reviewed by me and considered  in my medical decision making (see chart for details).   Patient with puncture wound to the left 1st web space. Plain film without FB. Swelling noted. No compartment syndrome. Normal flexor/extensor function. Good peripheral circulation. Wound cleaned with iodine and dressed. Plan for compression and bandaging along with Augmentin and hand surgery f/u in the coming week. Discussed risk for infection and reasons to return to the ED immediately. Will leave  the wound open.    ____________________________________________  FINAL CLINICAL IMPRESSION(S) / ED DIAGNOSES  Final diagnoses:  Puncture wound of left hand without foreign body, initial encounter    NEW OUTPATIENT MEDICATIONS STARTED DURING THIS VISIT:  Discharge Medication List as of 01/02/2020  9:35 PM    START taking these medications   Details  amoxicillin-clavulanate (AUGMENTIN) 875-125 MG tablet Take 1 tablet by mouth every 12 (twelve) hours for 7 days., Starting Sun 01/02/2020, Until Sun 01/09/2020, Print        Note:  This document was prepared using Dragon voice recognition software and may include unintentional dictation errors.  Nanda Quinton, MD, [facility]    Tijuana Scheidegger, [facility], MD 01/03/20 1030

## 2020-08-23 ENCOUNTER — Emergency Department (HOSPITAL_BASED_OUTPATIENT_CLINIC_OR_DEPARTMENT_OTHER)
Admission: EM | Admit: 2020-08-23 | Discharge: 2020-08-24 | Disposition: A | Discharge status: Home / Self Care | Payer: 59 | Attending: Emergency Medicine | Admitting: Emergency Medicine

## 2020-08-23 ENCOUNTER — Encounter (HOSPITAL_BASED_OUTPATIENT_CLINIC_OR_DEPARTMENT_OTHER): Payer: Self-pay | Admitting: *Deleted

## 2020-08-23 ENCOUNTER — Other Ambulatory Visit: Payer: Self-pay

## 2020-08-23 DIAGNOSIS — F909 Attention-deficit hyperactivity disorder, unspecified type: Secondary | ICD-10-CM | POA: Insufficient documentation

## 2020-08-23 DIAGNOSIS — Z87891 Personal history of nicotine dependence: Secondary | ICD-10-CM | POA: Insufficient documentation

## 2020-08-23 DIAGNOSIS — L304 Erythema intertrigo: Secondary | ICD-10-CM | POA: Insufficient documentation

## 2020-08-23 DIAGNOSIS — B372 Candidiasis of skin and nail: Secondary | ICD-10-CM

## 2020-08-23 NOTE — ED Triage Notes (Signed)
C/o rash bw legs and scrotum x 1 day

## 2020-08-24 MED ORDER — NYSTATIN 100000 UNIT/GM EX POWD: 1.0000 "application " | Freq: Three times a day (TID) | CUTANEOUS | 1 refills | Status: DC

## 2020-08-24 NOTE — ED Notes (Signed)
Discharge instructions discussed with patient. Prescription discussed. Verbalized understanding.

## 2020-08-24 NOTE — Discharge Instructions (Addendum)
Begin using nystatin powder as prescribed.  Allow the area to air dry as much as possible for the next several days.  Return to the ER if symptoms significantly worsen or change.

## 2020-08-24 NOTE — ED Provider Notes (Signed)
[facility] EMERGENCY DEPARTMENT Provider Note   CSN: [id number] Arrival date & time: 08/23/20  2130     History Chief Complaint  Patient presents with  . Rash    Chase Chavez is a 34 y.o. male.  Patient is a 34 year old male with history of ADHD and GERD.  He presents today for evaluation of rash to his left groin.  This has been worsening over the past 2 days.  He describes a burning area in the crease of his left leg.  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  The history is provided by the patient.  Rash Quality: painful and redness   Pain details:    Quality:  Burning   Severity:  Moderate   Onset quality:  Sudden   Duration:  2 days   Timing:  Constant   Progression:  Worsening Severity:  Moderate Timing:  Constant      Past Medical History:  Diagnosis Date  . ADHD   . GERD (gastroesophageal reflux disease)     Patient Active Problem List   Diagnosis Date Noted  . 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 ABDOMINAL SURGERY    . EYE SURGERY    . HAND SURGERY    . TONSILLECTOMY         No family history on file.  Social History   Tobacco Use  . Smoking status: Former Smoker    Types: Cigarettes  . Smokeless tobacco: Never Used  Vaping Use  . Vaping Use: Some days  Substance Use Topics  . Alcohol use: Yes    Comment: occ  . Drug use: No    Home Medications Prior to Admission medications   Medication Sig Start Date End Date Taking? Authorizing Provider  Diclofenac Sodium (PENNSAID) 2 % SOLN Place 1 application onto the skin 2 (two) times daily. 03/25/19   Myra Rude, MD  ibuprofen (ADVIL,MOTRIN) 200 MG tablet Take 600-800 mg by mouth every 6 (six) hours as needed for moderate pain.    [provider]  naproxen (NAPROSYN) 500 MG tablet Take 1 tablet (500 mg total) by mouth 2 (two)  times daily with a meal. As needed for pain 03/25/19   Linwood Dibbles, MD  Vitamin D, Ergocalciferol, (DRISDOL) 1.25 MG (50000 UT) CAPS capsule Take 1 capsule (50,000 Units total) by mouth every 7 (seven) days. Take for 8 total doses(weeks) 03/30/19   Myra Rude, MD    Allergies    Patient has no known allergies.  Review of Systems   Review of Systems  Skin: Positive for rash.  All other systems reviewed and are negative.   Physical Exam Updated Vital Signs BP 128/79   Pulse 96   Temp 98.8 F (37.1 C) (Oral)   Resp 18   Ht 5\' 6"  (1.676 m)   Wt 131.5 kg   SpO2 97%   BMI 46.81 kg/m   Physical Exam Vitals and nursing note reviewed.  Constitutional:      General: He is not in acute distress.    Appearance: Normal appearance. He is not ill-appearing, toxic-appearing or diaphoretic.  HENT:     Head: Normocephalic and atraumatic.  Pulmonary:     Effort: Pulmonary effort is normal.  Genitourinary:    Penis: Normal.      Testes: Normal.     Comments: The left groin has an area of erythema,  skin breakdown, and weeping clear fluid.  Penis and testicles appear normal. Skin:    General: Skin is warm and dry.  Neurological:     Mental Status: He is alert.     ED Results / Procedures / Treatments   Labs (all labs ordered are listed, but only abnormal results are displayed) Labs Reviewed - No data to display  EKG None  Radiology No results found.  Procedures Procedures (including critical care time)  Medications Ordered in ED Medications - No data to display  ED Course  I have reviewed the triage vital signs and the nursing notes.  Pertinent labs & imaging results that were available during my care of the patient were reviewed by me and considered in my medical decision making (see chart for details).    MDM Rules/Calculators/A&P  Patient appears to have a candidal infection to the left groin in the intertrigo.  This will be treated with nystatin powder and air  drying.  He is to return as needed if not improving.  Work excuse for 2 days given.  Final Clinical Impression(s) / ED Diagnoses Final diagnoses:  None    Rx / DC Orders ED Discharge Orders    None       Geoffery Lyons, MD 08/24/20 [phone]

## 2020-10-28 ENCOUNTER — Other Ambulatory Visit: Payer: Self-pay

## 2020-10-28 ENCOUNTER — Encounter (HOSPITAL_BASED_OUTPATIENT_CLINIC_OR_DEPARTMENT_OTHER): Payer: Self-pay | Admitting: Emergency Medicine

## 2020-10-28 DIAGNOSIS — M5442 Lumbago with sciatica, left side: Secondary | ICD-10-CM | POA: Insufficient documentation

## 2020-10-28 DIAGNOSIS — Z87891 Personal history of nicotine dependence: Secondary | ICD-10-CM | POA: Insufficient documentation

## 2020-10-28 DIAGNOSIS — K219 Gastro-esophageal reflux disease without esophagitis: Secondary | ICD-10-CM | POA: Insufficient documentation

## 2020-10-28 DIAGNOSIS — R059 Cough, unspecified: Secondary | ICD-10-CM | POA: Insufficient documentation

## 2020-10-28 DIAGNOSIS — R109 Unspecified abdominal pain: Secondary | ICD-10-CM | POA: Insufficient documentation

## 2020-10-28 NOTE — ED Triage Notes (Signed)
Reports left flank pain that started today.  States I think its my kidney.  Denies any urinary symptoms.

## 2020-10-29 ENCOUNTER — Emergency Department (HOSPITAL_BASED_OUTPATIENT_CLINIC_OR_DEPARTMENT_OTHER): Payer: Self-pay

## 2020-10-29 ENCOUNTER — Emergency Department (HOSPITAL_BASED_OUTPATIENT_CLINIC_OR_DEPARTMENT_OTHER)
Admission: EM | Admit: 2020-10-29 | Discharge: 2020-10-29 | Disposition: A | Discharge status: Home / Self Care | Payer: Self-pay | Attending: Emergency Medicine | Admitting: Emergency Medicine

## 2020-10-29 DIAGNOSIS — M545 Low back pain, unspecified: Secondary | ICD-10-CM

## 2020-10-29 LAB — URINALYSIS, ROUTINE W REFLEX MICROSCOPIC
Bilirubin Urine: NEGATIVE
Glucose, UA: NEGATIVE mg/dL
Hgb urine dipstick: NEGATIVE
Ketones, ur: 5 mg/dL — AB
Leukocytes,Ua: NEGATIVE
Nitrite: NEGATIVE
Protein, ur: NEGATIVE mg/dL
Specific Gravity, Urine: 1.03 (ref 1.005–1.030)
pH: 5 (ref 5.0–8.0)

## 2020-10-29 MED ORDER — NAPROXEN 500 MG PO TABS: 500.0000 mg | ORAL_TABLET | Freq: Two times a day (BID) | ORAL | 0 refills | Status: DC

## 2020-10-29 MED ORDER — KETOROLAC TROMETHAMINE 15 MG/ML IJ SOLN
30.0000 mg | Freq: Once | INTRAMUSCULAR | Status: AC
  Administered 2020-10-29: 30 mg via INTRAMUSCULAR
  Filled 2020-10-29: qty 2

## 2020-10-29 NOTE — ED Provider Notes (Signed)
[facility] EMERGENCY DEPARTMENT Provider Note   CSN: [id number] Arrival date & time: 10/28/20  2233     History Chief Complaint  Patient presents with   back pain    Brenda Cowher is a 34 y.o. male.  HPI     This is a 34 year old male with a history of ADHD and reflux who presents with left-sided back pain.  Patient reports left-sided back and flank pain that started yesterday.  He reports that sharp.  It is worse with certain movements and with coughing.  He reports "I think it is my kidney."  He denies any urinary symptoms including hematuria or dysuria.  No history of kidney stones.  Denies abdominal pain, nausea, vomiting, diarrhea.  Denies lower extremity weakness, numbness, tingling.  Pain is currently 6 out of 10.  He took ibuprofen at home with minimal relief.  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  Social History   Tobacco Use   Smoking status: Former Smoker    Types: Cigarettes   Smokeless tobacco: Never Used  Building services engineer Use: Some days  Substance Use Topics   Alcohol use: Yes    Comment: occ   Drug use: No    Home Medications Prior to Admission medications   Medication Sig Start Date End Date Taking? Authorizing Provider  Diclofenac Sodium (PENNSAID) 2 % SOLN Place 1 application onto the skin 2 (two) times daily. 03/25/19   Myra Rude, MD  ibuprofen (ADVIL,MOTRIN) 200 MG tablet Take 600-800 mg by mouth every 6 (six) hours as needed for moderate pain.    [provider]  naproxen (NAPROSYN) 500 MG tablet Take 1 tablet (500 mg total) by mouth 2 (two) times daily. 10/29/20   Demaree Liberto,  Mayer Masker, MD  nystatin (MYCOSTATIN/NYSTOP) powder Apply 1 application topically 3 (three) times daily. 08/24/20   Geoffery Lyons, MD  omeprazole (PRILOSEC) 40 MG capsule omeprazole 40 mg capsule,delayed release  TAKE 1 CAPSULE BY MOUTH EVERY DAY    [provider]  Vitamin D, Ergocalciferol, (DRISDOL) 1.25 MG (50000 UT) CAPS capsule Take 1 capsule (50,000 Units total) by mouth every 7 (seven) days. Take for 8 total doses(weeks) 03/30/19   Myra Rude, MD    Allergies    Patient has no known allergies.  Review of Systems   Review of Systems  Constitutional: Negative for fever.  Respiratory: Negative for shortness of breath.   Cardiovascular: Negative for chest pain.  Gastrointestinal: Negative for abdominal pain, diarrhea, nausea and vomiting.  Genitourinary: Negative for flank pain, frequency and hematuria.  Musculoskeletal: Positive for back pain.  Neurological: Negative for weakness and numbness.  All other systems reviewed and are negative.   Physical Exam Updated Vital Signs BP (!) 161/86 (BP Location: Right Arm)    Pulse (!) 101    Temp 98.2 F (36.8 C) (Oral)    Resp 18    Ht 1.702 m (5\' 7" )    Wt 124.7 kg    SpO2 98%    BMI 43.07 kg/m   Physical Exam  Vitals and nursing note reviewed.  Constitutional:      Appearance: He is well-developed and well-nourished. He is obese. He is not ill-appearing.  HENT:     Head: Normocephalic and atraumatic.     Nose: Nose normal.     Mouth/Throat:     Mouth: Mucous membranes are moist.  Eyes:     Pupils: Pupils are equal, round, and reactive to light.  Cardiovascular:     Rate and Rhythm: Normal rate and regular rhythm.     Heart sounds: Normal heart sounds. No murmur heard.   Pulmonary:     Effort: Pulmonary effort is normal. No respiratory distress.     Breath sounds: Normal breath sounds. No wheezing.  Abdominal:     General: Bowel sounds are normal.     Palpations: Abdomen is soft.     Tenderness: There is  no abdominal tenderness. There is no right CVA tenderness, left CVA tenderness or rebound.  Musculoskeletal:        General: No edema.     Cervical back: Neck supple.     Right lower leg: No edema.     Left lower leg: No edema.     Comments: Negative straight leg raise  Lymphadenopathy:     Cervical: No cervical adenopathy.  Skin:    General: Skin is warm and dry.  Neurological:     Mental Status: He is alert and oriented to person, place, and time.     Comments: 5 out of 5 strength bilateral lower extremities, no clonus  Psychiatric:        Mood and Affect: Mood and affect and mood normal.     ED Results / Procedures / Treatments   Labs (all labs ordered are listed, but only abnormal results are displayed) Labs Reviewed  URINALYSIS, ROUTINE W REFLEX MICROSCOPIC - Abnormal; Notable for the following components:      Result Value   Ketones, ur 5 (*)    All other components within normal limits    EKG None  Radiology CT Renal Stone Study  Result Date: 10/29/2020 CLINICAL DATA:  Left flank pain. EXAM: CT ABDOMEN AND PELVIS WITHOUT CONTRAST TECHNIQUE: Multidetector CT imaging of the abdomen and pelvis was performed following the standard protocol without IV contrast. COMPARISON:  None. FINDINGS: Lower chest: No acute abnormality. Hepatobiliary: No focal liver abnormality is seen. No gallstones, gallbladder wall thickening, or biliary dilatation. Pancreas: Unremarkable. No pancreatic ductal dilatation or surrounding inflammatory changes. Spleen: Normal in size without focal abnormality. Adrenals/Urinary Tract: Adrenal glands are unremarkable. Kidneys are normal in size, without renal calculi or hydronephrosis. A 4.2 cm x 3.0 cm exophytic cyst is seen along the anteromedial aspect of the mid left kidney. Bladder is unremarkable. Stomach/Bowel: Stomach is within normal limits. Appendix appears normal. No evidence of bowel wall thickening, distention, or inflammatory changes.  Vascular/Lymphatic: No significant vascular findings are present. No enlarged abdominal or pelvic lymph nodes. Reproductive: Prostate is unremarkable. Other: No abdominal wall hernia or abnormality. No abdominopelvic ascites. Musculoskeletal: No acute or significant osseous findings. IMPRESSION: 1. No evidence of renal calculi or hydronephrosis. 2. Exophytic left renal cyst. Electronically Signed   By: Aram Candela M.D.   On: 10/29/2020 02:13    Procedures Procedures (including critical care time)  Medications Ordered in ED Medications  ketorolac (TORADOL) 15 MG/ML injection 30 mg (30 mg Intramuscular Given 10/29/20 0140)    ED Course  I have reviewed the triage vital signs and the nursing notes.  Pertinent  labs & imaging results that were available during my care of the patient were reviewed by me and considered in my medical decision making (see chart for details).    MDM Rules/Calculators/A&P                          Patient presents with left-sided lower back pain.  Overall nontoxic and vital signs are reassuring.  He does have some hypertension with a blood pressure of 161/86.  He has no urinary symptoms but think this may be kidney related.  No history of kidney stones.  This is a consideration.  He does not have significant reproducible pain on exam but location could also be musculoskeletal in nature.  Will obtain CT scan to evaluate for stone.  Urinalysis evaluated shows no evidence of UTI.  No hemoglobin either.  Patient was given Toradol.  CT scan is reassuring.  He has a renal cyst but no evidence of renal stone.  Highly suspect musculoskeletal etiology.  No signs or symptoms of cauda equina.  Will discharge home with anti-inflammatories.  After history, exam, and medical workup I feel the patient has been appropriately medically screened and is safe for discharge home. Pertinent diagnoses were discussed with the patient. Patient was given return precautions.  Final Clinical  Impression(s) / ED Diagnoses Final diagnoses:  Acute left-sided low back pain without sciatica    Rx / DC Orders ED Discharge Orders         Ordered    naproxen (NAPROSYN) 500 MG tablet  2 times daily        10/29/20 0240           Carlon Davidson, Mayer Masker, MD 10/29/20 [phone]

## 2020-10-29 NOTE — Discharge Instructions (Addendum)
You were seen today for back pain.  Your CT scan does not show any evidence of kidney stones and your urinalysis is reassuring.  Given that the pain is worse with certain movements and coughing, suspect he may have a musculoskeletal cause.  Take naproxen twice daily for pain.

## 2020-12-22 DIAGNOSIS — R7303 Prediabetes: Secondary | ICD-10-CM | POA: Diagnosis not present

## 2021-03-18 IMAGING — CT CT RENAL STONE PROTOCOL
2 of 4 series · 17 of 46 positions shown, 19 images · non-contrast
Comparison: None.

CLINICAL DATA: Left flank pain.

EXAM:
CT ABDOMEN AND PELVIS WITHOUT CONTRAST
TECHNIQUE: Multidetector CT imaging of the abdomen and pelvis was performed
following the standard protocol without IV contrast.

[Series 2: axial st · axial · 0.87mm/px · z∈[-289,+166]mm · 14 of 101 slices shown, 16 images]
[im 5/101  soft-tissue]
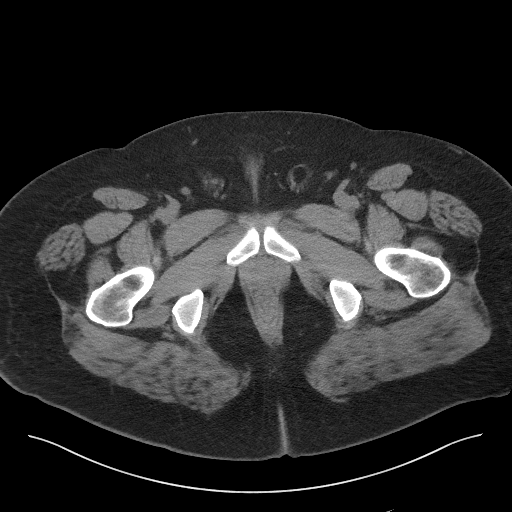
[im 5/101  bone]
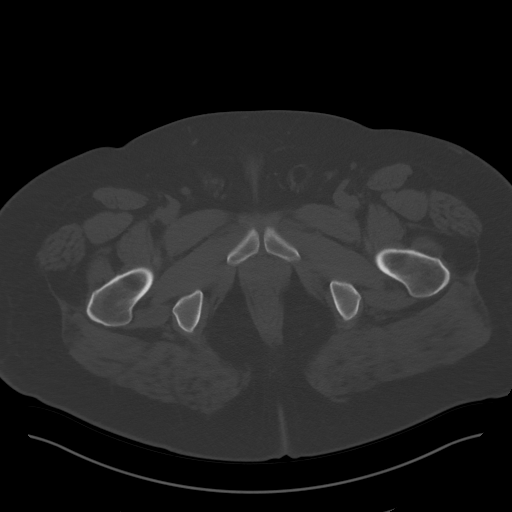
[im 14/101  soft-tissue]
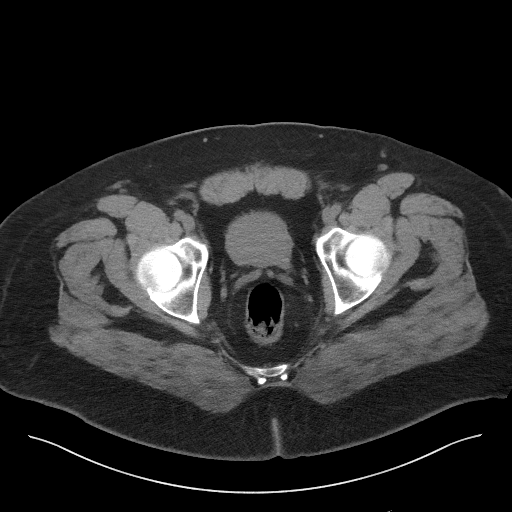
[im 18/101  soft-tissue]
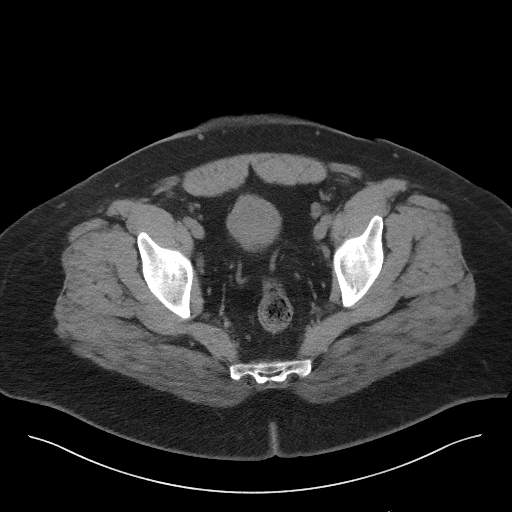
[im 27/101  soft-tissue]
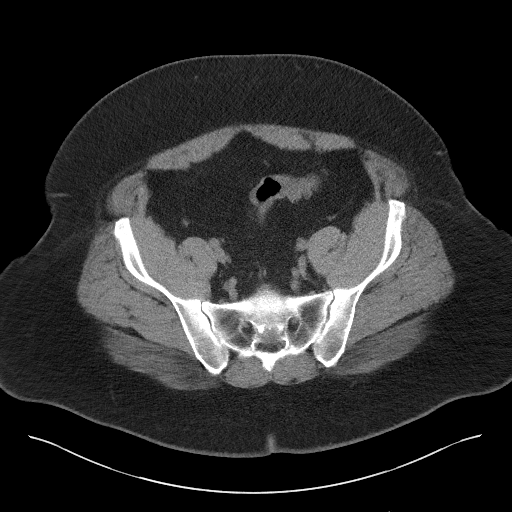
[im 35/101  soft-tissue]
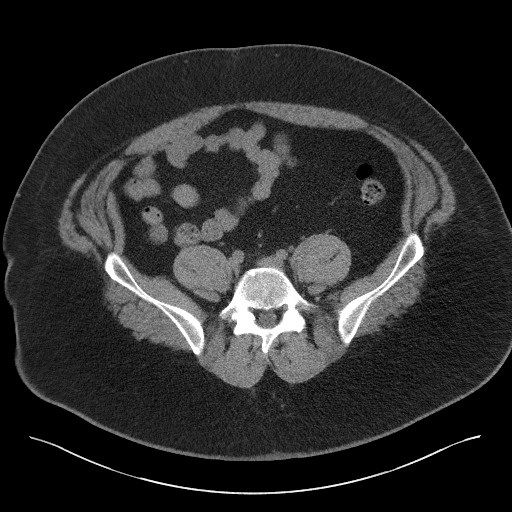
[im 40/101  soft-tissue]
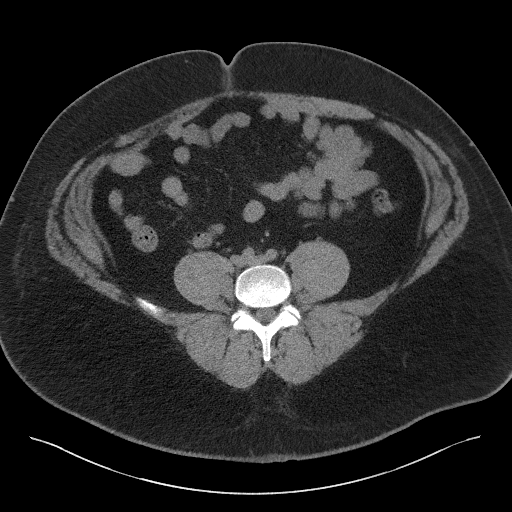
[im 48/101  soft-tissue]
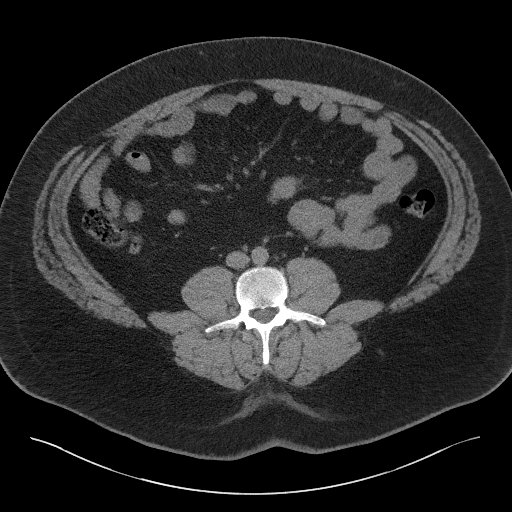
[im 53/101  soft-tissue]
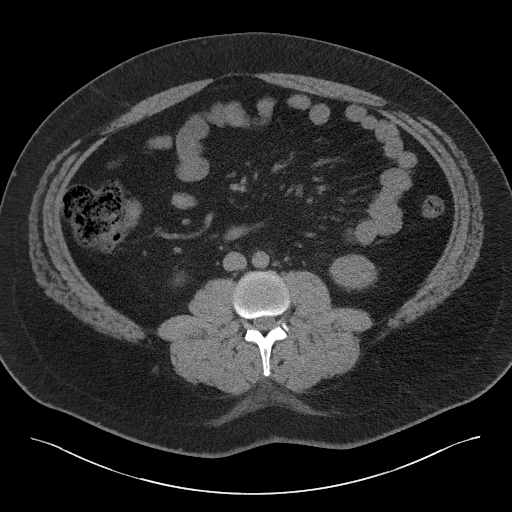
[im 61/101  soft-tissue]
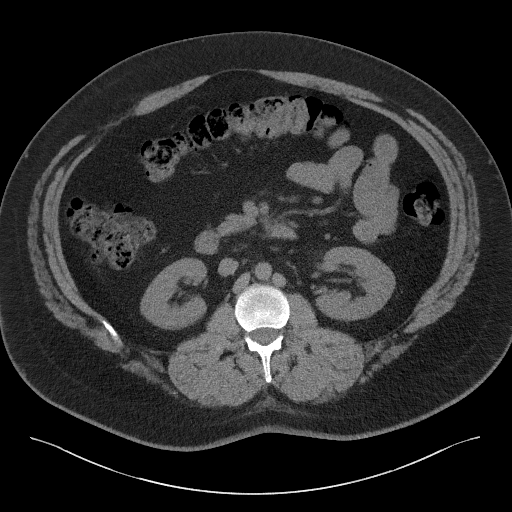
[im 61/101  bone]
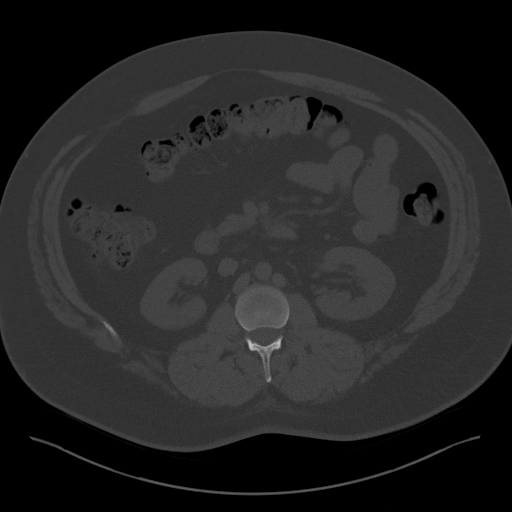
[im 66/101  soft-tissue]
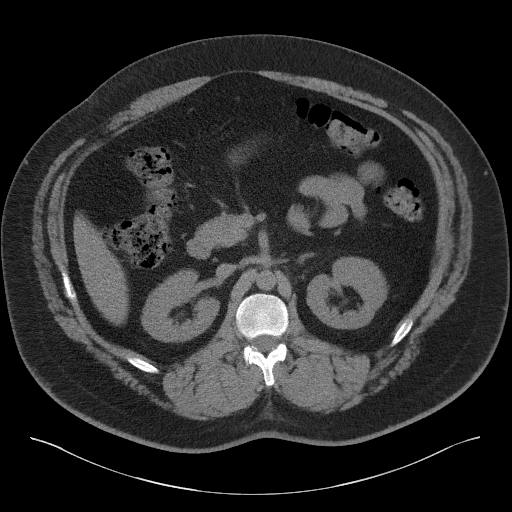
[im 74/101  soft-tissue]
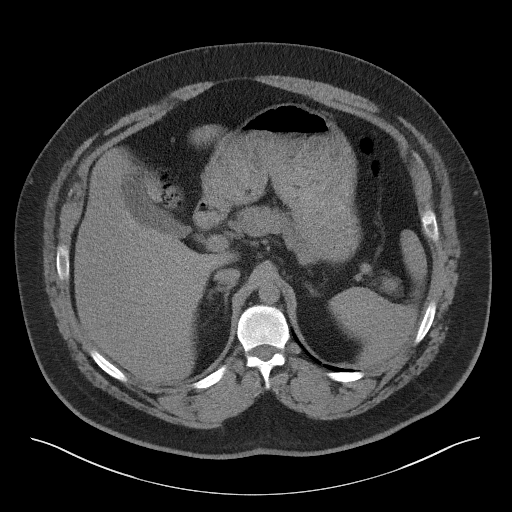
[im 83/101  soft-tissue]
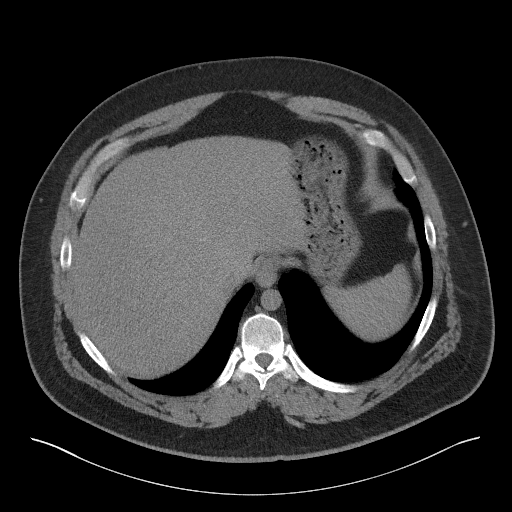
[im 87/101  soft-tissue]
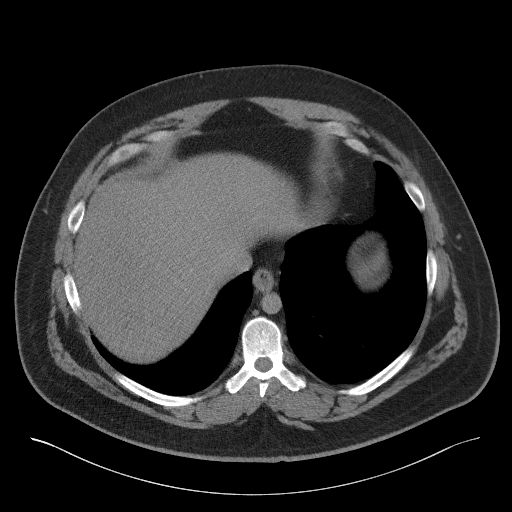
[im 96/101  soft-tissue]
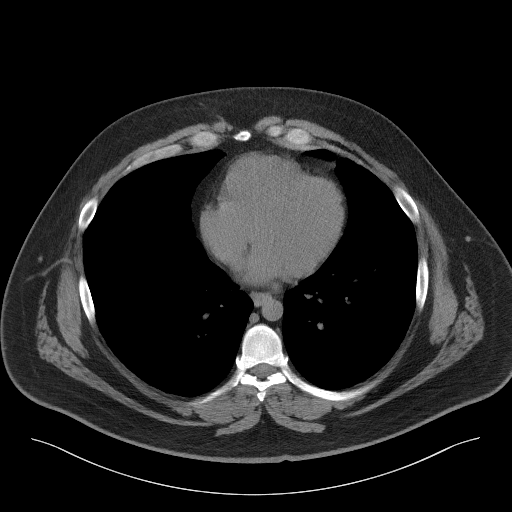

[Series 4: coronal st · coronal · 0.98mm/px · 3 of 116 slices shown]
[im 39/116  soft-tissue]
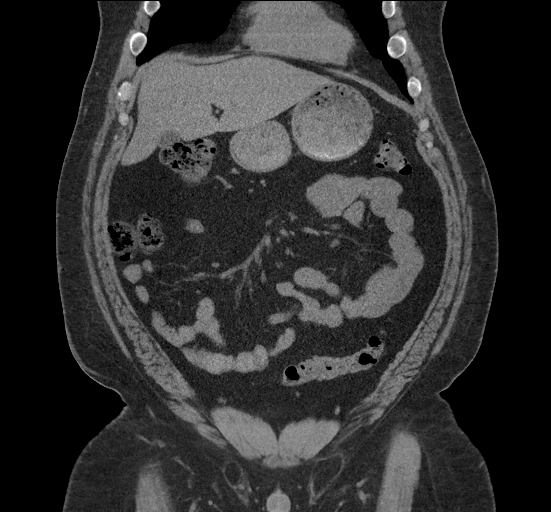
[im 52/116  soft-tissue]
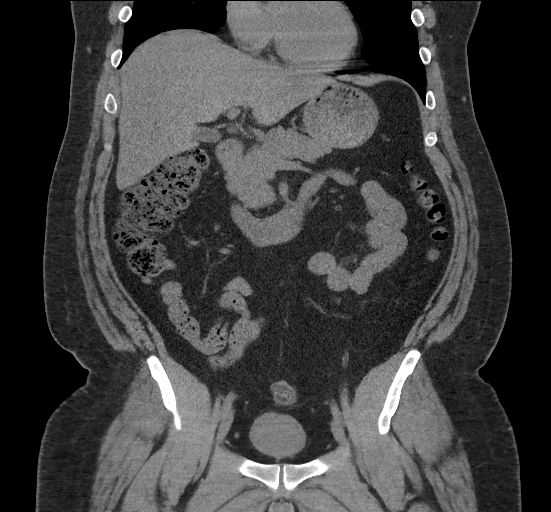
[im 64/116  soft-tissue]
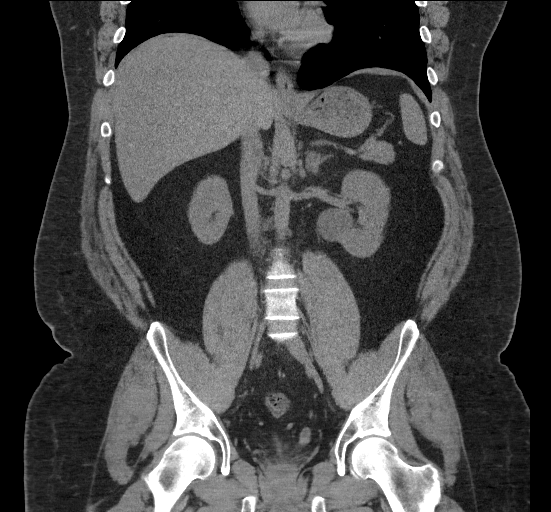

[17 of 46 positions shown; findings below may reference images not displayed]

FINDINGS: Lower chest: No acute abnormality.

Hepatobiliary: No focal liver abnormality is seen. No gallstones,
gallbladder wall thickening, or biliary dilatation.

Pancreas: Unremarkable. No pancreatic ductal dilatation or
surrounding inflammatory changes.

Spleen: Normal in size without focal abnormality.

Adrenals/Urinary Tract: Adrenal glands are unremarkable. Kidneys are
normal in size, without renal calculi or hydronephrosis. A 4.2 cm x
3.0 cm exophytic cyst is seen along the anteromedial aspect of the
mid left kidney. Bladder is unremarkable.

Stomach/Bowel: Stomach is within normal limits. Appendix appears
normal. No evidence of bowel wall thickening, distention, or
inflammatory changes.

Vascular/Lymphatic: No significant vascular findings are present. No
enlarged abdominal or pelvic lymph nodes.

Reproductive: Prostate is unremarkable.

Other: No abdominal wall hernia or abnormality. No abdominopelvic
ascites.

Musculoskeletal: No acute or significant osseous findings.
IMPRESSION: 1. No evidence of renal calculi or hydronephrosis.
2. Exophytic left renal cyst.

## 2021-04-20 DIAGNOSIS — R7301 Impaired fasting glucose: Secondary | ICD-10-CM | POA: Diagnosis not present

## 2021-08-24 DIAGNOSIS — F988 Other specified behavioral and emotional disorders with onset usually occurring in childhood and adolescence: Secondary | ICD-10-CM | POA: Diagnosis not present

## 2021-08-24 DIAGNOSIS — L03119 Cellulitis of unspecified part of limb: Secondary | ICD-10-CM | POA: Diagnosis not present

## 2021-08-24 DIAGNOSIS — L301 Dyshidrosis [pompholyx]: Secondary | ICD-10-CM | POA: Diagnosis not present

## 2021-08-24 DIAGNOSIS — L02619 Cutaneous abscess of unspecified foot: Secondary | ICD-10-CM | POA: Diagnosis not present

## 2021-11-01 ENCOUNTER — Other Ambulatory Visit: Payer: Self-pay

## 2021-11-01 ENCOUNTER — Emergency Department (HOSPITAL_BASED_OUTPATIENT_CLINIC_OR_DEPARTMENT_OTHER)
Admission: EM | Admit: 2021-11-01 | Discharge: 2021-11-01 | Disposition: A | Discharge status: Home / Self Care | Payer: BC Managed Care – PPO | Attending: Emergency Medicine | Admitting: Emergency Medicine

## 2021-11-01 ENCOUNTER — Encounter (HOSPITAL_BASED_OUTPATIENT_CLINIC_OR_DEPARTMENT_OTHER): Payer: Self-pay | Admitting: *Deleted

## 2021-11-01 DIAGNOSIS — R109 Unspecified abdominal pain: Secondary | ICD-10-CM | POA: Insufficient documentation

## 2021-11-01 DIAGNOSIS — Z20822 Contact with and (suspected) exposure to covid-19: Secondary | ICD-10-CM | POA: Diagnosis not present

## 2021-11-01 DIAGNOSIS — R197 Diarrhea, unspecified: Secondary | ICD-10-CM

## 2021-11-01 DIAGNOSIS — Z87891 Personal history of nicotine dependence: Secondary | ICD-10-CM | POA: Diagnosis not present

## 2021-11-01 LAB — URINALYSIS, MICROSCOPIC (REFLEX)

## 2021-11-01 LAB — CBC
HCT: 45.8 % (ref 39.0–52.0)
Hemoglobin: 15.6 g/dL (ref 13.0–17.0)
MCH: 30.2 pg (ref 26.0–34.0)
MCHC: 34.1 g/dL (ref 30.0–36.0)
MCV: 88.8 fL (ref 80.0–100.0)
Platelets: 287 10*3/uL (ref 150–400)
RBC: 5.16 MIL/uL (ref 4.22–5.81)
RDW: 12.1 % (ref 11.5–15.5)
WBC: 7.4 10*3/uL (ref 4.0–10.5)
nRBC: 0 % (ref 0.0–0.2)

## 2021-11-01 LAB — COMPREHENSIVE METABOLIC PANEL
ALT: 56 U/L — ABNORMAL HIGH (ref 0–44)
AST: 13 U/L — ABNORMAL LOW (ref 15–41)
Albumin: 4.2 g/dL (ref 3.5–5.0)
Alkaline Phosphatase: 84 U/L (ref 38–126)
Anion gap: 11 (ref 5–15)
BUN: 16 mg/dL (ref 6–20)
CO2: 24 mmol/L (ref 22–32)
Calcium: 9 mg/dL (ref 8.9–10.3)
Chloride: 99 mmol/L (ref 98–111)
Creatinine, Ser: 1.05 mg/dL (ref 0.61–1.24)
GFR, Estimated: >60 mL/min (ref >60)
Glucose, Bld: 112 mg/dL — ABNORMAL HIGH (ref 70–99)
Potassium: 3.7 mmol/L (ref 3.5–5.1)
Sodium: 134 mmol/L — ABNORMAL LOW (ref 135–145)
Total Bilirubin: 0.5 mg/dL (ref 0.3–1.2)
Total Protein: 8 g/dL (ref 6.5–8.1)

## 2021-11-01 LAB — LIPASE, BLOOD: Lipase: 31 U/L (ref 11–51)

## 2021-11-01 LAB — URINALYSIS, ROUTINE W REFLEX MICROSCOPIC
Bilirubin Urine: NEGATIVE
Glucose, UA: NEGATIVE mg/dL
Hgb urine dipstick: NEGATIVE
Ketones, ur: NEGATIVE mg/dL
Leukocytes,Ua: NEGATIVE
Nitrite: NEGATIVE
Protein, ur: 30 mg/dL — AB
Specific Gravity, Urine: >=1.03 (ref 1.005–1.030)
pH: 5.5 (ref 5.0–8.0)

## 2021-11-01 LAB — RESP PANEL BY RT-PCR (FLU A&B, COVID) ARPGX2
Influenza A by PCR: NEGATIVE
Influenza B by PCR: NEGATIVE
SARS Coronavirus 2 by RT PCR: NEGATIVE

## 2021-11-01 NOTE — Discharge Instructions (Addendum)
Like we discussed, please make sure you are staying adequately hydrated.  I would recommend electrolyte replacement beverages such as Gatorade or Powerade.  You can also try Imodium for your diarrhea.  I have also attached information on food choices to help relieve diarrhea.  Please check the results of your COVID-19 test and flu test on MyChart.  If you develop any new or worsening abdominal pain, bloody diarrhea, fevers, or generally worsening symptoms, please come back to the emergency department immediately.

## 2021-11-01 NOTE — ED Provider Notes (Signed)
[facility] EMERGENCY DEPARTMENT Provider Note   CSN: [id number] Arrival date & time: 11/01/21  1733     History Chief Complaint  Patient presents with   Abdominal Pain    Chase Chavez is a 35 y.o. male.  HPI Patient is a 35 year old male who presents to the emergency department due to diarrhea.  Patient states his symptoms started 3 days ago.  States he is having multiple bouts of watery brown diarrhea.  No hematochezia.  Also notes intermittent diffuse abdominal pain.  No nausea, vomiting, URI symptoms, urinary complaints.  Patient states he ate [employer] prior to the onset of his symptoms.  Denies any recent travel.    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  Social History   Tobacco Use   Smoking status: Former    Types: Cigarettes   Smokeless tobacco: Never  Vaping Use   Vaping Use: Some days  Substance Use Topics   Alcohol use: Yes    Comment: occ   Drug use: No    Home Medications Prior to Admission medications   Medication Sig Start Date End Date Taking? Authorizing Provider  Diclofenac Sodium (PENNSAID) 2 % SOLN Place 1 application onto the skin 2 (two) times daily. 03/25/19   Myra Rude, MD  ibuprofen (ADVIL,MOTRIN) 200 MG tablet Take 600-800 mg by mouth every 6 (six) hours as needed for moderate pain.    [provider]  naproxen (NAPROSYN) 500 MG tablet Take 1 tablet (500 mg total) by mouth 2 (two) times daily. 10/29/20   Horton, Mayer Masker, MD  nystatin (MYCOSTATIN/NYSTOP) powder Apply 1 application topically 3 (three) times daily. 08/24/20   Geoffery Lyons, MD  omeprazole (PRILOSEC) 40 MG capsule  omeprazole 40 mg capsule,delayed release  TAKE 1 CAPSULE BY MOUTH EVERY DAY    [provider]  Vitamin D, Ergocalciferol, (DRISDOL) 1.25 MG (50000 UT) CAPS capsule Take 1 capsule (50,000 Units total) by mouth every 7 (seven) days. Take for 8 total doses(weeks) 03/30/19   Myra Rude, MD    Allergies    Patient has no known allergies.  Review of Systems   Review of Systems  All other systems reviewed and are negative. Ten systems reviewed and are negative for acute change, except as noted in the HPI.   Physical Exam Updated Vital Signs BP (!) 156/83    Pulse 99    Temp 98.6 F (37 C) (Oral)    Resp 18    Ht 5\' 7"  (1.702 m)    Wt 136.1 kg    SpO2 95%    BMI 46.99 kg/m   Physical Exam Vitals and nursing note reviewed.  Constitutional:      General: He is not in acute distress.    Appearance: Normal appearance. He is well-developed. He is not ill-appearing, toxic-appearing or diaphoretic.  HENT:     Head: Normocephalic and atraumatic.     Right Ear: External ear normal.     Left Ear: External ear normal.     Nose: Nose normal.     Mouth/Throat:  Mouth: Mucous membranes are moist.     Pharynx: Oropharynx is clear. No oropharyngeal exudate or posterior oropharyngeal erythema.  Eyes:     Extraocular Movements: Extraocular movements intact.  Cardiovascular:     Rate and Rhythm: Normal rate and regular rhythm.     Pulses: Normal pulses.     Heart sounds: Normal heart sounds. No murmur heard.   No friction rub. No gallop.  Pulmonary:     Effort: Pulmonary effort is normal. No respiratory distress.     Breath sounds: Normal breath sounds. No stridor. No wheezing, rhonchi or rales.  Abdominal:     General: Abdomen is protuberant.     Palpations: Abdomen is soft.     Tenderness: There is no abdominal tenderness.     Comments: Protuberant abdomen that is soft and nontender in all 4 quadrants.  Musculoskeletal:        General: Normal range of motion.     Cervical  back: Normal range of motion and neck supple. No tenderness.  Skin:    General: Skin is warm and dry.  Neurological:     General: No focal deficit present.     Mental Status: He is alert and oriented to person, place, and time.  Psychiatric:        Mood and Affect: Mood normal.        Behavior: Behavior normal.   ED Results / Procedures / Treatments   Labs (all labs ordered are listed, but only abnormal results are displayed) Labs Reviewed  COMPREHENSIVE METABOLIC PANEL - Abnormal; Notable for the following components:      Result Value   Sodium 134 (*)    Glucose, Bld 112 (*)    AST 13 (*)    ALT 56 (*)    All other components within normal limits  URINALYSIS, ROUTINE W REFLEX MICROSCOPIC - Abnormal; Notable for the following components:   Protein, ur 30 (*)    All other components within normal limits  URINALYSIS, MICROSCOPIC (REFLEX) - Abnormal; Notable for the following components:   Bacteria, UA RARE (*)    All other components within normal limits  RESP PANEL BY RT-PCR (FLU A&B, COVID) ARPGX2  LIPASE, BLOOD  CBC   EKG None  Radiology No results found.  Procedures Procedures   Medications Ordered in ED Medications - No data to display  ED Course  I have reviewed the triage vital signs and the nursing notes.  Pertinent labs & imaging results that were available during my care of the patient were reviewed by me and considered in my medical decision making (see chart for details).    MDM Rules/Calculators/A&P                          Patient is a 35 year old male who presents to the emergency department due to diarrhea for the past 3 days as well as intermittent abdominal pain.  CBC without leukocytosis.  CMP with a sodium of 134, glucose of 112, AST of 13, ALT of 56.  UA with 30 protein and rare bacteria.  Lipase within normal limits at 31.  Respiratory panel is pending.  On my exam the abdomen is soft and nontender.  Heart is regular rate and rhythm without  murmurs, rubs, or gallops.  Lungs are clear to auscultation bilaterally.  Patient is nontoxic-appearing, afebrile, and not tachycardic.  Patient does not appear to have a surgical abdomen.  Denies any hematochezia.  Denies any other  complaints.    Did not feel that imaging was warranted at this time and patient is agreeable.  Discussed dietary choices for management of his diarrhea.  Recommended Imodium.  Recommended adequate hydration.  We discussed return precautions in length.  Feel that he is stable for discharge at this time and he is agreeable.  His questions were answered and he was amicable at the time of discharge.  Final Clinical Impression(s) / ED Diagnoses Final diagnoses:  Diarrhea, unspecified type   Rx / DC Orders ED Discharge Orders     None        Rabon, Scholle, Cordelia Poche 11/01/21 2211    Cheryll Cockayne, MD 11/01/21 2351

## 2021-11-01 NOTE — ED Triage Notes (Signed)
C/o diffuse abd pain and diarrhea x 3 days

## 2022-09-04 ENCOUNTER — Ambulatory Visit (INDEPENDENT_AMBULATORY_CARE_PROVIDER_SITE_OTHER): Payer: BC Managed Care – PPO | Admitting: Medical

## 2022-09-04 ENCOUNTER — Encounter: Payer: Self-pay | Admitting: Medical

## 2022-09-04 VITALS — BP 118/70 | HR 88 | Temp 98.0°F | Resp 18 | Ht 67.0 in | Wt 318.4 lb

## 2022-09-04 DIAGNOSIS — F419 Anxiety disorder, unspecified: Secondary | ICD-10-CM

## 2022-09-04 DIAGNOSIS — F32A Depression, unspecified: Secondary | ICD-10-CM | POA: Diagnosis not present

## 2022-09-04 DIAGNOSIS — Z0000 Encounter for general adult medical examination without abnormal findings: Secondary | ICD-10-CM

## 2022-09-04 DIAGNOSIS — K219 Gastro-esophageal reflux disease without esophagitis: Secondary | ICD-10-CM | POA: Diagnosis not present

## 2022-09-04 DIAGNOSIS — R0683 Snoring: Secondary | ICD-10-CM | POA: Diagnosis not present

## 2022-09-04 LAB — CBC WITH DIFFERENTIAL/PLATELET
Basophils Absolute: 0.1 10*3/uL (ref 0.0–0.1)
Basophils Relative: 0.9 % (ref 0.0–3.0)
Eosinophils Absolute: 0.2 10*3/uL (ref 0.0–0.7)
Eosinophils Relative: 2.3 % (ref 0.0–5.0)
HCT: 45 % (ref 39.0–52.0)
Hemoglobin: 14.9 g/dL (ref 13.0–17.0)
Lymphocytes Relative: 26.4 % (ref 12.0–46.0)
Lymphs Abs: 2.5 10*3/uL (ref 0.7–4.0)
MCHC: 33.1 g/dL (ref 30.0–36.0)
MCV: 90.4 fl (ref 78.0–100.0)
Monocytes Absolute: 0.7 10*3/uL (ref 0.1–1.0)
Monocytes Relative: 7.2 % (ref 3.0–12.0)
Neutro Abs: 6 10*3/uL (ref 1.4–7.7)
Neutrophils Relative %: 63.2 % (ref 43.0–77.0)
Platelets: 325 10*3/uL (ref 150.0–400.0)
RBC: 4.98 Mil/uL (ref 4.22–5.81)
RDW: 13.3 % (ref 11.5–15.5)
WBC: 9.5 10*3/uL (ref 4.0–10.5)

## 2022-09-04 LAB — LIPID PANEL
Cholesterol: 211 mg/dL — ABNORMAL HIGH (ref 0–200)
HDL: 47.5 mg/dL (ref >39.00)
LDL Cholesterol: 126 mg/dL — ABNORMAL HIGH (ref 0–99)
NonHDL: 163.08
Total CHOL/HDL Ratio: 4
Triglycerides: 184 mg/dL — ABNORMAL HIGH (ref 0.0–149.0)
VLDL: 36.8 mg/dL (ref 0.0–40.0)

## 2022-09-04 LAB — COMPREHENSIVE METABOLIC PANEL
ALT: 46 U/L (ref 0–53)
AST: 8 U/L (ref 0–37)
Albumin: 4.4 g/dL (ref 3.5–5.2)
Alkaline Phosphatase: 97 U/L (ref 39–117)
BUN: 20 mg/dL (ref 6–23)
CO2: 31 mEq/L (ref 19–32)
Calcium: 9.7 mg/dL (ref 8.4–10.5)
Chloride: 98 mEq/L (ref 96–112)
Creatinine, Ser: 0.98 mg/dL (ref 0.40–1.50)
GFR: 99.89 mL/min (ref >60.00)
Glucose, Bld: 157 mg/dL — ABNORMAL HIGH (ref 70–99)
Potassium: 5.1 mEq/L (ref 3.5–5.1)
Sodium: 136 mEq/L (ref 135–145)
Total Bilirubin: 0.6 mg/dL (ref 0.2–1.2)
Total Protein: 7.5 g/dL (ref 6.0–8.3)

## 2022-09-04 NOTE — Progress Notes (Addendum)
Subjective:    Patient ID: Chase Chavez, male    DOB: 1986-02-23, 36 y.o.   MRN: [id number]  HPI  Pt in for first time. Pt is fasting. Decided to go ahead and do wellness exam.  Pt works on Geographical information systems officer. Pt does not exercise regularly. Pt states moderate healthy but does admit 3-4 cheer wine and sweet tea. No smoking but does vape. Alcohol-  presently one night every 4 months. 2-3 beers.  Declines flu vaccine. Prior 2 shot initial covid series.   Genella Rife- controlled with omeprazole for past 2-3 years. Describes reflux symptoms as 36 year old.  ADHD- has not been on meds for more than a year. Pt states his ADHD is severe. Former pcp rx'd med. (Pt states dexadrin?)       Review of Systems  Constitutional:  Negative for chills, diaphoresis and fatigue.  HENT:  Negative for congestion and postnasal drip.        Pt does snore.   Respiratory:  Negative for cough, chest tightness, shortness of breath and wheezing.   Cardiovascular:  Negative for chest pain and palpitations.  Gastrointestinal:  Negative for abdominal pain.       Gerd symptoms controlled if taking omeprazole.  Genitourinary:  Negative for dysuria and hematuria.       ED. States depressed mood effects his libido.   Musculoskeletal:  Negative for back pain and neck pain.  Skin:  Negative for rash.  Neurological:  Negative for dizziness, numbness and headaches.  Hematological:  Negative for adenopathy. Does not bruise/bleed easily.  Psychiatric/Behavioral:  Positive for decreased concentration and dysphoric mood. Negative for behavioral problems, sleep disturbance and suicidal ideas. The patient is nervous/anxious.        Pt states couple of years he has been depressed. He has been thinkng of going to counseling for about 10 years.   Phq-9 16. Gad-7 9    Past Medical History:  Diagnosis Date   ADHD    GERD (gastroesophageal reflux disease)      Social History   Socioeconomic History   Marital status:  Significant Other    Spouse name: Not on file   Number of children: Not on file   Years of education: Not on file   Highest education level: Not on file  Occupational History   Not on file  Tobacco Use   Smoking status: Former    Packs/day: 1.00    Years: 12.00    Total pack years: 12.00    Types: Cigarettes    Quit date: 05/06/2017    Years since quitting: 5.3   Smokeless tobacco: Never  Vaping Use   Vaping Use: Some days   Start date: 06/16/2017  Substance and Sexual Activity   Alcohol use: Yes    Comment: occ. 3-4 beers every 4 months.   Drug use: No   Sexual activity: Yes  Other Topics Concern   Not on file  Social History Narrative   Not on file   Social Determinants of Health   Financial Resource Strain: Not on file  Food Insecurity: Not on file  Transportation Needs: Not on file  Physical Activity: Not on file  Stress: Not on file  Social Connections: Not on file  Intimate Partner Violence: Not on file    Past Surgical History:  Procedure Laterality Date   ABDOMINAL SURGERY     EYE SURGERY     HAND SURGERY     TONSILLECTOMY      History reviewed.  No pertinent family history.  No Known Allergies  Current Outpatient Medications on File Prior to Visit  Medication Sig Dispense Refill   omeprazole (PRILOSEC) 40 MG capsule omeprazole 40 mg capsule,delayed release  TAKE 1 CAPSULE BY MOUTH EVERY DAY     No current facility-administered medications on file prior to visit.    BP 130/88   Pulse 100   Temp 98 F (36.7 C)   Resp 18   Ht 5\' 7"  (1.702 m)   Wt (!) 318 lb 6.4 oz (144.4 kg)   SpO2 99%   BMI 49.87 kg/m        Objective:   Physical Exam  General Mental Status- Alert. General Appearance- Not in acute distress.   Skin General: Color- Normal Color. Moisture- Normal Moisture.  Neck Carotid Arteries- Normal color. Moisture- Normal Moisture. No carotid bruits. No JVD.  Chest and Lung Exam Auscultation: Breath  Sounds:-Normal.  Cardiovascular Auscultation:Rythm- Regular. Murmurs & Other Heart Sounds:Auscultation of the heart reveals- No Murmurs.  Abdomen Inspection:-Inspeection Normal. Palpation/Percussion:Note:No mass. Palpation and Percussion of the abdomen reveal- Non Tender, Non Distended + BS, no rebound or guarding.   Neurologic Cranial Nerve exam:- CN III-XII intact(No nystagmus), symmetric smile. Strength:- 5/5 equal and symmetric strength both upper and lower extremities.       Assessment & Plan:    For you wellness exam today I have ordered cbc, cmp and lipid panel.   Flu vaccine declined.   Recommend exercise and healthy diet.  We will let you know lab results as they come in.  Follow up date appointment will be determined after lab review.    For gerd continue with omeprazole. Since 15 year history referred to GI MD. Possible EGD.  ADHD history. New to practice. Borderline bp and high  pulse initially but better on recheck. Will need to follow your vitals in future and labs. Then consider ADHD meds if you want to get back on meds.  For snoring placed referral to pulmolongist.  For depression and anxiety, I am providing you with list of counselors. I want you to call and identify which office you will see. Then let me know who you chose. Will place the referral. Might consider meds in future if needed.  Mackie Pai, PA-C

## 2022-09-04 NOTE — Patient Instructions (Addendum)
For you wellness exam today I have ordered cbc, cmp and lipid panel.   Flu vaccine declined.   Recommend exercise and healthy diet.  We will let you know lab results as they come in.  Follow up date appointment will be determined after lab review.    For gerd continue with omeprazole. Since 15 year history referred to GI MD. Possible EGD.  ADHD history. New to practice. Borderline bp and high  pulse initially but better on recheck. Will need to follow your vitals in future and labs. Then consider ADHD meds if you want to get back on meds.  For snoring placed referral to pulmolongist.  For depression and anxiety, I am providing you with list of counselors. I want you to call and identify which office you will see. Then let me know who you chose. Will place the referral. Might consider meds in future if needed.     Preventive Care 58-74 Years Old, Male Preventive care refers to lifestyle choices and visits with your health care provider that can promote health and wellness. Preventive care visits are also called wellness exams. What can I expect for my preventive care visit? Counseling During your preventive care visit, your health care provider may ask about your: Medical history, including: Past medical problems. Family medical history. Current health, including: Emotional well-being. Home life and relationship well-being. Sexual activity. Lifestyle, including: Alcohol, nicotine or tobacco, and drug use. Access to firearms. Diet, exercise, and sleep habits. Safety issues such as seatbelt and bike helmet use. Sunscreen use. Work and work Statistician. Physical exam Your health care provider may check your: Height and weight. These may be used to calculate your BMI (body mass index). BMI is a measurement that tells if you are at a healthy weight. Waist circumference. This measures the distance around your waistline. This measurement also tells if you are at a healthy weight  and may help predict your risk of certain diseases, such as type 2 diabetes and high blood pressure. Heart rate and blood pressure. Body temperature. Skin for abnormal spots. What immunizations do I need?  Vaccines are usually given at various ages, according to a schedule. Your health care provider will recommend vaccines for you based on your age, medical history, and lifestyle or other factors, such as travel or where you work. What tests do I need? Screening Your health care provider may recommend screening tests for certain conditions. This may include: Lipid and cholesterol levels. Diabetes screening. This is done by checking your blood sugar (glucose) after you have not eaten for a while (fasting). Hepatitis B test. Hepatitis C test. HIV (human immunodeficiency virus) test. STI (sexually transmitted infection) testing, if you are at risk. Talk with your health care provider about your test results, treatment options, and if necessary, the need for more tests. Follow these instructions at home: Eating and drinking  Eat a healthy diet that includes fresh fruits and vegetables, whole grains, lean protein, and low-fat dairy products. Drink enough fluid to keep your urine pale yellow. Take vitamin and mineral supplements as recommended by your health care provider. Do not drink alcohol if your health care provider tells you not to drink. If you drink alcohol: Limit how much you have to 0-2 drinks a day. Know how much alcohol is in your drink. In the [place] one drink equals one 12 oz bottle of beer (355 mL), one 5 oz glass of wine (148 mL), or one 1 oz glass of hard liquor (44 mL). Lifestyle  Brush your teeth every morning and night with fluoride toothpaste. Floss one time each day. Exercise for at least 30 minutes 5 or more days each week. Do not use any products that contain nicotine or tobacco. These products include cigarettes, chewing tobacco, and vaping devices, such as  e-cigarettes. If you need help quitting, ask your health care provider. Do not use drugs. If you are sexually active, practice safe sex. Use a condom or other form of protection to prevent STIs. Find healthy ways to manage stress, such as: Meditation, yoga, or listening to music. Journaling. Talking to a trusted person. Spending time with friends and family. Minimize exposure to UV radiation to reduce your risk of skin cancer. Safety Always wear your seat belt while driving or riding in a vehicle. Do not drive: If you have been drinking alcohol. Do not ride with someone who has been drinking. If you have been using any mind-altering substances or drugs. While texting. When you are tired or distracted. Wear a helmet and other protective equipment during sports activities. If you have firearms in your house, make sure you follow all gun safety procedures. Seek help if you have been physically or sexually abused. What's next? Go to your health care provider once a year for an annual wellness visit. Ask your health care provider how often you should have your eyes and teeth checked. Stay up to date on all vaccines. This information is not intended to replace advice given to you by your health care provider. Make sure you discuss any questions you have with your health care provider. Document Revised: 04/18/2021 Document Reviewed: 04/18/2021 Elsevier Patient Education  2023 [employer].

## 2022-09-05 ENCOUNTER — Other Ambulatory Visit: Payer: Self-pay | Admitting: *Deleted

## 2022-09-05 ENCOUNTER — Other Ambulatory Visit (INDEPENDENT_AMBULATORY_CARE_PROVIDER_SITE_OTHER): Payer: BC Managed Care – PPO

## 2022-09-05 DIAGNOSIS — R739 Hyperglycemia, unspecified: Secondary | ICD-10-CM

## 2022-09-05 LAB — HEMOGLOBIN A1C: Hgb A1c MFr Bld: 7.5 % — ABNORMAL HIGH (ref 4.6–6.5)

## 2022-09-06 MED ORDER — METFORMIN HCL 500 MG PO TABS: 500.0000 mg | ORAL_TABLET | Freq: Every day | ORAL | 0 refills | Status: DC

## 2022-09-16 ENCOUNTER — Telehealth: Payer: Self-pay | Admitting: Medical

## 2022-09-16 MED ORDER — OMEPRAZOLE 40 MG PO CPDR: DELAYED_RELEASE_CAPSULE | ORAL | 2 refills | Status: DC

## 2022-09-16 NOTE — Addendum Note (Signed)
Addended by: Maximino Sarin on: 09/16/2022 10:28 AM   Modules accepted: Orders

## 2022-09-16 NOTE — Telephone Encounter (Signed)
Rx sent 

## 2022-09-16 NOTE — Telephone Encounter (Signed)
Medication: omeprazole (PRILOSEC) 40 MG capsule  Has the patient contacted their pharmacy? No.   Preferred Pharmacy:   [facility] [address] [place], [place] - [address], [place] [address] Phone: [phone]  Fax: [phone]

## 2022-09-22 DIAGNOSIS — F909 Attention-deficit hyperactivity disorder, unspecified type: Secondary | ICD-10-CM | POA: Insufficient documentation

## 2022-09-22 NOTE — Progress Notes (Deleted)
 @  Patient ID: Chase Chavez, male    DOB: Aug 08, 1986, 36 y.o.   MRN: [id number]  No chief complaint on file.   Referring provider: Marisue Brooklyn  HPI: 36 year old male, former smoker. PMH significant for ADHD.  09/23/2022 Patient presents today for sleep consult.          No Known Allergies  Immunization History  Administered Date(s) Administered   Tdap 01/25/2015    Past Medical History:  Diagnosis Date   ADHD    GERD (gastroesophageal reflux disease)     Tobacco History: Social History   Tobacco Use  Smoking Status Former   Packs/day: 1.00   Years: 12.00   Total pack years: 12.00   Types: Cigarettes   Quit date: 05/06/2017   Years since quitting: 5.3  Smokeless Tobacco Never   Counseling given: Not Answered   Outpatient Medications Prior to Visit  Medication Sig Dispense Refill   metFORMIN (GLUCOPHAGE) 500 MG tablet Take 1 tablet (500 mg total) by mouth daily with breakfast. 90 tablet 0   omeprazole (PRILOSEC) 40 MG capsule omeprazole 40 mg capsule,delayed release  TAKE 1 CAPSULE BY MOUTH EVERY DAY 30 capsule 2   No facility-administered medications prior to visit.      Review of Systems  Review of Systems   Physical Exam  There were no vitals taken for this visit. Physical Exam   Lab Results:  CBC    Component Value Date/Time   WBC 9.5 09/04/2022 1216   RBC 4.98 09/04/2022 1216   HGB 14.9 09/04/2022 1216   HCT 45.0 09/04/2022 1216   PLT 325.0 09/04/2022 1216   MCV 90.4 09/04/2022 1216   MCH 30.2 11/01/2021 1814   MCHC 33.1 09/04/2022 1216   RDW 13.3 09/04/2022 1216   LYMPHSABS 2.5 09/04/2022 1216   MONOABS 0.7 09/04/2022 1216   EOSABS 0.2 09/04/2022 1216   BASOSABS 0.1 09/04/2022 1216    BMET    Component Value Date/Time   NA 136 09/04/2022 1216   K 5.1 09/04/2022 1216   CL 98 09/04/2022 1216   CO2 31 09/04/2022 1216   GLUCOSE 157 (H) 09/04/2022 1216   BUN 20 09/04/2022 1216   CREATININE 0.98 09/04/2022  1216   CALCIUM 9.7 09/04/2022 1216   GFRNONAA >60 11/01/2021 1814   GFRAA >60 07/19/2018 1536    BNP No results found for: "BNP"  ProBNP No results found for: "PROBNP"  Imaging: No results found.   Assessment & Plan:   No problem-specific Assessment & Plan notes found for this encounter.     Glenford Bayley, NP 09/22/2022

## 2022-09-23 ENCOUNTER — Institutional Professional Consult (permissible substitution): Payer: BC Managed Care – PPO | Admitting: Primary Care

## 2022-10-04 ENCOUNTER — Encounter: Payer: Self-pay | Admitting: Gastroenterology

## 2022-10-30 ENCOUNTER — Ambulatory Visit: Payer: BC Managed Care – PPO | Admitting: Gastroenterology

## 2023-01-05 ENCOUNTER — Emergency Department (HOSPITAL_BASED_OUTPATIENT_CLINIC_OR_DEPARTMENT_OTHER)
Admission: EM | Admit: 2023-01-05 | Discharge: 2023-01-05 | Discharge status: Against Medical Advice | Payer: BC Managed Care – PPO | Attending: Emergency Medicine | Admitting: Emergency Medicine

## 2023-01-05 ENCOUNTER — Other Ambulatory Visit: Payer: Self-pay

## 2023-01-05 ENCOUNTER — Encounter (HOSPITAL_BASED_OUTPATIENT_CLINIC_OR_DEPARTMENT_OTHER): Payer: Self-pay

## 2023-01-05 DIAGNOSIS — Z5321 Procedure and treatment not carried out due to patient leaving prior to being seen by health care provider: Secondary | ICD-10-CM | POA: Insufficient documentation

## 2023-01-05 DIAGNOSIS — H6123 Impacted cerumen, bilateral: Secondary | ICD-10-CM | POA: Insufficient documentation

## 2023-01-05 NOTE — ED Notes (Signed)
Na x 1 for room.

## 2023-01-05 NOTE — ED Notes (Signed)
Na x 2 for room.

## 2023-01-05 NOTE — ED Triage Notes (Signed)
Pt arrives with c/o bilateral impacted ear wax. Per pt, ear wax is causing pain.

## 2023-01-07 DIAGNOSIS — H6121 Impacted cerumen, right ear: Secondary | ICD-10-CM | POA: Diagnosis not present

## 2023-02-06 ENCOUNTER — Other Ambulatory Visit: Payer: Self-pay | Admitting: Medical

## 2023-02-06 DIAGNOSIS — R739 Hyperglycemia, unspecified: Secondary | ICD-10-CM

## 2023-02-17 ENCOUNTER — Encounter: Payer: Self-pay | Admitting: *Deleted

## 2024-02-23 ENCOUNTER — Ambulatory Visit: Admitting: Medical
